# Patient Record
Sex: Female | Born: 1965 | Race: White | Hispanic: No | Marital: Married | Smoking: Current every day smoker
Health system: Southern US, Community
[De-identification: ages and names within clinical notes are randomized; demographics above are authoritative.]

## PROBLEM LIST (undated history)

## (undated) DIAGNOSIS — Z9889 Other specified postprocedural states: Secondary | ICD-10-CM

## (undated) DIAGNOSIS — Z87898 Personal history of other specified conditions: Secondary | ICD-10-CM

## (undated) DIAGNOSIS — N200 Calculus of kidney: Secondary | ICD-10-CM

## (undated) DIAGNOSIS — Z87442 Personal history of urinary calculi: Secondary | ICD-10-CM

## (undated) DIAGNOSIS — F329 Major depressive disorder, single episode, unspecified: Secondary | ICD-10-CM

## (undated) DIAGNOSIS — I4510 Unspecified right bundle-branch block: Secondary | ICD-10-CM

## (undated) DIAGNOSIS — F32A Depression, unspecified: Secondary | ICD-10-CM

## (undated) DIAGNOSIS — R112 Nausea with vomiting, unspecified: Secondary | ICD-10-CM

## (undated) HISTORY — PX: TONSILLECTOMY AND ADENOIDECTOMY: SUR1326

## (undated) HISTORY — PX: NASAL FRACTURE SURGERY: SHX718

## (undated) HISTORY — PX: EXTRACORPOREAL SHOCK WAVE LITHOTRIPSY: SHX1557

---

## 1998-01-15 ENCOUNTER — Other Ambulatory Visit: Admission: RE | Admit: 1998-01-15 | Discharge: 1998-01-15 | Payer: Self-pay | Admitting: *Deleted

## 1999-05-20 ENCOUNTER — Other Ambulatory Visit: Admission: RE | Admit: 1999-05-20 | Discharge: 1999-05-20 | Payer: Self-pay | Admitting: *Deleted

## 1999-05-27 ENCOUNTER — Encounter: Payer: Self-pay | Admitting: *Deleted

## 1999-05-27 ENCOUNTER — Ambulatory Visit (HOSPITAL_COMMUNITY): Admission: RE | Admit: 1999-05-27 | Discharge: 1999-05-27 | Payer: Self-pay | Admitting: *Deleted

## 2000-08-15 ENCOUNTER — Emergency Department (HOSPITAL_COMMUNITY): Admission: EM | Admit: 2000-08-15 | Discharge: 2000-08-16 | Payer: Self-pay | Admitting: *Deleted

## 2000-08-16 ENCOUNTER — Encounter: Payer: Self-pay | Admitting: Emergency Medicine

## 2002-11-29 ENCOUNTER — Other Ambulatory Visit: Admission: RE | Admit: 2002-11-29 | Discharge: 2002-11-29 | Payer: Self-pay | Admitting: Obstetrics and Gynecology

## 2003-04-25 ENCOUNTER — Ambulatory Visit (HOSPITAL_COMMUNITY): Admission: RE | Admit: 2003-04-25 | Discharge: 2003-04-25 | Payer: Self-pay | Admitting: Urology

## 2003-04-27 ENCOUNTER — Emergency Department (HOSPITAL_COMMUNITY): Admission: EM | Admit: 2003-04-27 | Discharge: 2003-04-27 | Payer: Self-pay | Admitting: Emergency Medicine

## 2003-12-16 ENCOUNTER — Ambulatory Visit (HOSPITAL_COMMUNITY): Admission: RE | Admit: 2003-12-16 | Discharge: 2003-12-16 | Payer: Self-pay | Admitting: Urology

## 2004-11-12 ENCOUNTER — Other Ambulatory Visit: Admission: RE | Admit: 2004-11-12 | Discharge: 2004-11-12 | Payer: Self-pay | Admitting: Obstetrics and Gynecology

## 2005-01-28 ENCOUNTER — Encounter: Admission: RE | Admit: 2005-01-28 | Discharge: 2005-01-28 | Payer: Self-pay | Admitting: Obstetrics and Gynecology

## 2006-05-13 ENCOUNTER — Encounter: Admission: RE | Admit: 2006-05-13 | Discharge: 2006-05-13 | Payer: Self-pay | Admitting: Obstetrics and Gynecology

## 2008-05-20 ENCOUNTER — Emergency Department (HOSPITAL_COMMUNITY): Admission: EM | Admit: 2008-05-20 | Discharge: 2008-05-20 | Payer: Self-pay | Admitting: Emergency Medicine

## 2008-05-23 ENCOUNTER — Ambulatory Visit (HOSPITAL_COMMUNITY): Admission: RE | Admit: 2008-05-23 | Discharge: 2008-05-23 | Payer: Self-pay | Admitting: Urology

## 2009-03-17 ENCOUNTER — Emergency Department (HOSPITAL_COMMUNITY): Admission: EM | Admit: 2009-03-17 | Discharge: 2009-03-18 | Payer: Self-pay | Admitting: Emergency Medicine

## 2009-12-12 ENCOUNTER — Encounter: Admission: RE | Admit: 2009-12-12 | Discharge: 2009-12-12 | Payer: Self-pay | Admitting: Family Medicine

## 2009-12-19 ENCOUNTER — Encounter: Admission: RE | Admit: 2009-12-19 | Discharge: 2009-12-19 | Payer: Self-pay | Admitting: Family Medicine

## 2010-05-09 ENCOUNTER — Encounter: Payer: Self-pay | Admitting: Urology

## 2010-05-09 ENCOUNTER — Encounter: Payer: Self-pay | Admitting: Obstetrics and Gynecology

## 2010-07-22 LAB — URINALYSIS, ROUTINE W REFLEX MICROSCOPIC
Ketones, ur: NEGATIVE mg/dL
Leukocytes, UA: NEGATIVE
Nitrite: NEGATIVE
Protein, ur: NEGATIVE mg/dL
Urobilinogen, UA: 1 mg/dL (ref 0.0–1.0)
pH: 8 (ref 5.0–8.0)

## 2010-07-22 LAB — URINE MICROSCOPIC-ADD ON

## 2010-07-22 LAB — POCT PREGNANCY, URINE: Preg Test, Ur: NEGATIVE

## 2010-08-04 LAB — URINALYSIS, ROUTINE W REFLEX MICROSCOPIC
Bilirubin Urine: NEGATIVE
Glucose, UA: NEGATIVE mg/dL
Hgb urine dipstick: NEGATIVE
Hgb urine dipstick: NEGATIVE
Ketones, ur: 15 mg/dL — AB
Nitrite: NEGATIVE
Protein, ur: NEGATIVE mg/dL
Protein, ur: NEGATIVE mg/dL
Specific Gravity, Urine: 1.029 (ref 1.005–1.030)
Urobilinogen, UA: 0.2 mg/dL (ref 0.0–1.0)
pH: 6 (ref 5.0–8.0)

## 2010-08-04 LAB — BASIC METABOLIC PANEL
Calcium: 8.4 mg/dL (ref 8.4–10.5)
Creatinine, Ser: 1.32 mg/dL — ABNORMAL HIGH (ref 0.4–1.2)
GFR calc Af Amer: 53 mL/min — ABNORMAL LOW (ref >60)
GFR calc non Af Amer: 44 mL/min — ABNORMAL LOW (ref >60)

## 2010-08-04 LAB — URINE MICROSCOPIC-ADD ON

## 2010-08-04 LAB — CBC
HCT: 34.2 % — ABNORMAL LOW (ref 36.0–46.0)
Hemoglobin: 11.7 g/dL — ABNORMAL LOW (ref 12.0–15.0)
WBC: 7.7 10*3/uL (ref 4.0–10.5)

## 2010-10-29 ENCOUNTER — Observation Stay (HOSPITAL_COMMUNITY)
Admission: RE | Admit: 2010-10-29 | Discharge: 2010-10-30 | Disposition: A | Discharge status: Home / Self Care | Payer: PRIVATE HEALTH INSURANCE | Source: Ambulatory Visit | Attending: Urology | Admitting: Urology

## 2010-10-29 DIAGNOSIS — N200 Calculus of kidney: Secondary | ICD-10-CM | POA: Insufficient documentation

## 2010-10-29 DIAGNOSIS — R1032 Left lower quadrant pain: Secondary | ICD-10-CM | POA: Insufficient documentation

## 2010-10-29 DIAGNOSIS — N201 Calculus of ureter: Principal | ICD-10-CM | POA: Insufficient documentation

## 2010-10-29 DIAGNOSIS — R110 Nausea: Secondary | ICD-10-CM | POA: Insufficient documentation

## 2010-10-29 DIAGNOSIS — Z79899 Other long term (current) drug therapy: Secondary | ICD-10-CM | POA: Insufficient documentation

## 2010-10-29 DIAGNOSIS — F17200 Nicotine dependence, unspecified, uncomplicated: Secondary | ICD-10-CM | POA: Insufficient documentation

## 2010-10-29 DIAGNOSIS — R509 Fever, unspecified: Secondary | ICD-10-CM | POA: Insufficient documentation

## 2010-10-29 HISTORY — PX: OTHER SURGICAL HISTORY: SHX169

## 2010-10-29 LAB — SURGICAL PCR SCREEN
MRSA, PCR: NEGATIVE
Staphylococcus aureus: NEGATIVE

## 2010-10-30 LAB — URINE CULTURE
Colony Count: NO GROWTH
Culture: NO GROWTH
Special Requests: POSITIVE

## 2010-11-02 ENCOUNTER — Ambulatory Visit (HOSPITAL_COMMUNITY): Payer: PRIVATE HEALTH INSURANCE

## 2010-11-02 ENCOUNTER — Ambulatory Visit (HOSPITAL_COMMUNITY)
Admission: RE | Admit: 2010-11-02 | Discharge: 2010-11-02 | Disposition: A | Discharge status: Home / Self Care | Payer: PRIVATE HEALTH INSURANCE | Source: Ambulatory Visit | Attending: Urology | Admitting: Urology

## 2010-11-02 DIAGNOSIS — R1032 Left lower quadrant pain: Secondary | ICD-10-CM | POA: Insufficient documentation

## 2010-11-02 DIAGNOSIS — N201 Calculus of ureter: Secondary | ICD-10-CM | POA: Insufficient documentation

## 2010-11-02 DIAGNOSIS — R319 Hematuria, unspecified: Secondary | ICD-10-CM | POA: Insufficient documentation

## 2010-11-02 DIAGNOSIS — R110 Nausea: Secondary | ICD-10-CM | POA: Insufficient documentation

## 2010-11-02 DIAGNOSIS — N200 Calculus of kidney: Secondary | ICD-10-CM | POA: Insufficient documentation

## 2010-11-02 DIAGNOSIS — Z01818 Encounter for other preprocedural examination: Secondary | ICD-10-CM | POA: Insufficient documentation

## 2010-11-19 NOTE — Op Note (Signed)
  Angela Santos, Angela Santos                  ACCOUNT NO.:  [id number]  MEDICAL RECORD NO.:  [id number]  LOCATION:  1517                         FACILITY:  [facility]  PHYSICIAN:  Danae Chen, M.D.  DATE OF BIRTH:  31-Dec-1965  DATE OF PROCEDURE:  10/29/2010 DATE OF DISCHARGE:                              OPERATIVE REPORT   PREOPERATIVE DIAGNOSIS:  Left proximal ureteral calculus, brought with fever.  POSTOPERATIVE DIAGNOSIS:  Left proximal ureteral calculus, brought with fever.  PROCEDURE DONE:  Cystoscopy and left retrograde pyelogram and insertion of double-J stent.  SURGEON:  Danae Chen, M.D.  ANESTHESIA:  General.  INDICATION:  Patient is a 45 years old female with a known 6 mm left proximal ureteral calculus.  She has a past history of kidney stones. She had lithotripsy in the past.  She called the office today with temperature of 100.2.  She is scheduled for cystoscopy, retrograde pyelogram and double-J stent.  The patient was identified by her wrist band and proper time-out was taken.  DESCRIPTION OF PROCEDURE:  Under general anesthesia, she was prepped and draped and placed in the dorsal lithotomy position.  A panendoscope was inserted in the bladder.  Urine was sent for culture and sensitivity. The bladder mucosa is reddened.  There is no stone or tumor in the bladder.  The ureteral orifices are in normal position and shape.  Retrograde pyelogram:  A cone-tip catheter was passed through the cystoscope and through the left ureteral orifice.  Contrast was then injected through the cone-tip catheter.  The distal and mid ureter appear normal.  There is a filling defect in the proximal ureter and contrast would not go beyond the filling defect.  The cone-tip catheter was removed.  A sensor wire was passed over a #6-French open-ended catheter and they were both passed through the cystoscope and through the left ureteral orifice and advanced through the mid ureter.  The sensor  wire was removed.  Contrast was then injected through the open- ended catheter and there is a filling defect in the proximal ureter and this time contrast passed beyond the filling defect.  The ureter proximal to the stone appears moderately dilated as well as the collecting system.  Then, the sensor wire was passed through the open- ended catheter and the open-ended catheter was removed.  A #6-French - 24 double-J stent was then passed over the sensor wire.  The sensor wire was removed.  The proximal curl of the double-J stent is in the renal pelvis.  The distal curl is in the bladder.  The bladder was then emptied and the cystoscope removed.  The patient tolerated the procedure well and left the OR in satisfactory condition to postanesthesia care unit.     Danae Chen, M.D.     MN/MEDQ  D:  10/29/2010  T:  10/29/2010  Job:  [id number]  Electronically Signed by Lindaann Slough M.D. on 11/19/2010 09:36:41 PM

## 2011-08-23 ENCOUNTER — Other Ambulatory Visit: Payer: Self-pay | Admitting: Family Medicine

## 2011-08-23 DIAGNOSIS — R42 Dizziness and giddiness: Secondary | ICD-10-CM

## 2011-08-24 ENCOUNTER — Ambulatory Visit
Admission: RE | Admit: 2011-08-24 | Discharge: 2011-08-24 | Disposition: A | Discharge status: Home / Self Care | Payer: PRIVATE HEALTH INSURANCE | Source: Ambulatory Visit | Attending: Family Medicine | Admitting: Family Medicine

## 2011-08-24 DIAGNOSIS — R42 Dizziness and giddiness: Secondary | ICD-10-CM

## 2011-08-27 ENCOUNTER — Other Ambulatory Visit: Payer: Self-pay | Admitting: Family Medicine

## 2011-08-27 DIAGNOSIS — R55 Syncope and collapse: Secondary | ICD-10-CM

## 2011-08-27 DIAGNOSIS — R4701 Aphasia: Secondary | ICD-10-CM

## 2011-09-02 ENCOUNTER — Ambulatory Visit
Admission: RE | Admit: 2011-09-02 | Discharge: 2011-09-02 | Disposition: A | Discharge status: Home / Self Care | Payer: PRIVATE HEALTH INSURANCE | Source: Ambulatory Visit | Attending: Family Medicine | Admitting: Family Medicine

## 2011-09-02 DIAGNOSIS — R55 Syncope and collapse: Secondary | ICD-10-CM

## 2011-09-02 DIAGNOSIS — R4701 Aphasia: Secondary | ICD-10-CM

## 2013-04-19 DIAGNOSIS — Z9889 Other specified postprocedural states: Secondary | ICD-10-CM

## 2013-04-19 DIAGNOSIS — R112 Nausea with vomiting, unspecified: Secondary | ICD-10-CM

## 2013-04-19 HISTORY — DX: Other specified postprocedural states: Z98.890

## 2013-04-19 HISTORY — DX: Nausea with vomiting, unspecified: R11.2

## 2013-09-07 ENCOUNTER — Other Ambulatory Visit: Payer: Self-pay | Admitting: Urology

## 2013-09-12 ENCOUNTER — Encounter (HOSPITAL_COMMUNITY): Payer: Self-pay | Admitting: Pharmacy Technician

## 2013-09-14 ENCOUNTER — Encounter (HOSPITAL_COMMUNITY): Payer: Self-pay | Admitting: General Practice

## 2013-09-19 NOTE — H&P (Signed)
ctive Problems Problems   1. History of kidney stones (V13.01)  2. Nephrolithiasis (592.0)  3. Urinary tract infection (599.0)  History of Present Illness  Angela rDois Davenporteturns today in f/u for her history of stones and UTI's.   She remains on TMP for supression but is having some cloudy urine and some RLQ pain.  He UA looks infected today and on KUB her 10mm left renal stone appears to have moved to the UPJ.  She has additional RLP stones.  She has a tiny LLP stone.  She has had some left flank pain. She has had no fever but she does have some am nausea.   Past Medical History Problems   1. History of hematuria (V13.09)  2. History of kidney stones (V13.01)  3. History of Nephrolithiasis Of The Left Kidney (V13.01)  Surgical History Problems   1. History of Cystoscopy With Insertion Of Ureteral Stent Left  2. History of Lithotripsy  3. History of Lithotripsy  4. History of Lithotripsy  Current Meds  1. Paxil 20 MG Oral Tablet;  Therapy: (Recorded:02Feb2015) to Recorded  2. Trimethoprim 100 MG Oral Tablet; TAKE 1 TABLET Twice daily PT.IS TO TAKE 1 TAB BID  FOR 1 WEEK AND THEN 1 TAB HS;  Therapy: 23Mar2015 to (Last Rx:23Mar2015)  Requested for: 23Mar2015 Ordered  Allergies Medication   1. Penicillins  Family History Problems   1. Family history of Acute Myocardial Infarction (V17.3) : Father  2. Family history of Death In The Family Father : Mother   8163- MI  3. Family history of Family Health Status - Mother's Age   49  4. Family history of Family Health Status Number Of Children   1 son (12)  5. Family history of Heart Disease (V17.49) : Mother  6. Family history of Hematuria : Mother  647. Family history of Nephrolithiasis : Mother  678. Family history of Nephrolithiasis : Father  Social History Problems   1. Denied: History of Alcohol Use  2. Caffeine Use   2 per day  3. Current every day smoker (305.1)  4. Marital History - Currently Married  5. Occupation:    Sec.- [employer]  6. Tobacco Use (V15.82)   smoked 1ppd for 25 yrs & quit 11/16/06  Review of Systems Genitourinary, constitutional, skin, eye, otolaryngeal, hematologic/lymphatic, cardiovascular, pulmonary, endocrine, musculoskeletal, gastrointestinal, neurological and psychiatric system(s) were reviewed and pertinent findings if present are noted.  Genitourinary: cloudy urine, but no urinary frequency, no urinary urgency, no dysuria and no hematuria.  Gastrointestinal: no flank pain   The patient presents with complaints of abdominal pain (right lower quadrant).  Constitutional: no fever.    Vitals Vital Signs [Data Includes: Last 1 Day]  Recorded: 21May2015 02:59PM  Blood Pressure: 114 / 56 Temperature: 97.8 F Heart Rate: 59  Physical Exam Constitutional: Well nourished and well developed . No acute distress.  Pulmonary: No respiratory distress and normal respiratory rhythm and effort.  Cardiovascular: Heart rate and rhythm are normal . No peripheral edema.  Abdomen: The abdomen is flat. The abdomen is soft and nontender. No CVA tenderness.    Results/Data Urine [Data Includes: Last 1 Day]   21May2015  COLOR AMBER   APPEARANCE CLOUDY   SPECIFIC GRAVITY 1.025   pH 5.5   GLUCOSE NEG mg/dL  BILIRUBIN NEG   KETONE NEG mg/dL  BLOOD LARGE   PROTEIN TRACE mg/dL  UROBILINOGEN 2 mg/dL  NITRITE POS   LEUKOCYTE ESTERASE SMALL   SQUAMOUS EPITHELIAL/HPF NONE SEEN  WBC 7-10 WBC/hpf  RBC 21-50 RBC/hpf  BACTERIA MODERATE   CRYSTALS NONE SEEN   CASTS NONE SEEN    KUB today shows a 30mm right UPJ stone with smaller RLP stones. There is a 2-31mm LLP stone. She has no other ureteral or bladder stones. The bones, soft tissues and gas patterns are unremarkable.    Assessment  Laylonie has evidence of a recurrent/persistent UTI and her largest right renal stone has moved to the RUPJ.  She has no fever but does have some pain.   Plan  Health Maintenance   1. UA With REFLEX; [Do  Not Release]; Status:Resulted - Requires Verification;   Done:  21May2015 02:33PM Nephrolithiasis   2. RENAL U/S COMPLETE; Status:Hold For - Appointment,Date of Service; Requested  for:21May2015;   3. Follow-up Schedule Surgery Office  Follow-up  Status: Hold For - Appointment   Requested for: 21May2015 Nephrolithiasis, Urinary tract infection   4. Start: Ciprofloxacin HCl - 500 MG Oral Tablet; Take 1 tablet twice daily   Urine culture today. I will start Cipro pending the culture and stop the TMP. I discussed the treatment options including PCNL and ESWL.  With the number of stones I would be more reluctant to consider ureteroscopy.  She will get set up for ESWL no sooner than a week to allow treatment of the UTI. Risks of the ESWL reviewed.  Renal US today to assess the degree of obstruction if any.    The US showed mild hydro on the right.   Urine culture grew e. Coli and she is on Cipro per sensitivities.

## 2013-09-20 ENCOUNTER — Ambulatory Visit (HOSPITAL_COMMUNITY)
Admission: RE | Admit: 2013-09-20 | Discharge: 2013-09-20 | Disposition: A | Discharge status: Home / Self Care | Payer: PRIVATE HEALTH INSURANCE | Source: Ambulatory Visit | Attending: Urology | Admitting: Urology

## 2013-09-20 ENCOUNTER — Encounter (HOSPITAL_COMMUNITY): Payer: Self-pay | Admitting: *Deleted

## 2013-09-20 ENCOUNTER — Ambulatory Visit (HOSPITAL_COMMUNITY): Payer: PRIVATE HEALTH INSURANCE

## 2013-09-20 ENCOUNTER — Encounter (HOSPITAL_COMMUNITY)
Admission: RE | Disposition: A | Discharge status: Home / Self Care | Payer: Self-pay | Source: Ambulatory Visit | Attending: Urology

## 2013-09-20 DIAGNOSIS — Z79899 Other long term (current) drug therapy: Secondary | ICD-10-CM | POA: Insufficient documentation

## 2013-09-20 DIAGNOSIS — N390 Urinary tract infection, site not specified: Secondary | ICD-10-CM | POA: Insufficient documentation

## 2013-09-20 DIAGNOSIS — F17200 Nicotine dependence, unspecified, uncomplicated: Secondary | ICD-10-CM | POA: Insufficient documentation

## 2013-09-20 DIAGNOSIS — N200 Calculus of kidney: Secondary | ICD-10-CM | POA: Insufficient documentation

## 2013-09-20 HISTORY — DX: Calculus of kidney: N20.0

## 2013-09-20 HISTORY — DX: Depression, unspecified: F32.A

## 2013-09-20 HISTORY — DX: Major depressive disorder, single episode, unspecified: F32.9

## 2013-09-20 LAB — PREGNANCY, URINE: Preg Test, Ur: NEGATIVE

## 2013-09-20 SURGERY — LITHOTRIPSY, ESWL
Anesthesia: LOCAL | Laterality: Right

## 2013-09-20 MED ORDER — CIPROFLOXACIN HCL 500 MG PO TABS
500.0000 mg | ORAL_TABLET | ORAL | Status: AC
  Administered 2013-09-20: 500 mg via ORAL
  Filled 2013-09-20: qty 1

## 2013-09-20 MED ORDER — HYDROCODONE-ACETAMINOPHEN 5-325 MG PO TABS: 1.0000 | ORAL_TABLET | Freq: Four times a day (QID) | ORAL | Status: DC | PRN

## 2013-09-20 MED ORDER — DIAZEPAM 5 MG PO TABS
10.0000 mg | ORAL_TABLET | ORAL | Status: AC
  Administered 2013-09-20: 10 mg via ORAL
  Filled 2013-09-20: qty 2

## 2013-09-20 MED ORDER — PROMETHAZINE HCL 50 MG PO TABS: 25.0000 mg | ORAL_TABLET | Freq: Four times a day (QID) | ORAL | Status: DC | PRN

## 2013-09-20 MED ORDER — DEXTROSE-NACL 5-0.45 % IV SOLN
INTRAVENOUS | Status: DC
  Administered 2013-09-20: 08:00:00 via INTRAVENOUS

## 2013-09-20 MED ORDER — DIPHENHYDRAMINE HCL 25 MG PO CAPS
25.0000 mg | ORAL_CAPSULE | ORAL | Status: AC
  Administered 2013-09-20: 25 mg via ORAL
  Filled 2013-09-20: qty 1

## 2013-09-20 NOTE — Discharge Instructions (Signed)
Lithotripsy, Care After °Refer to this sheet in the next few weeks. These instructions provide you with information on caring for yourself after your procedure. Your health care provider may also give you more specific instructions. Your treatment has been planned according to current medical practices, but problems sometimes occur. Call your health care provider if you have any problems or questions after your procedure. °WHAT TO EXPECT AFTER THE PROCEDURE  °· Your urine may have a red tinge for a few days after treatment. Blood loss is usually minimal. °· You may have soreness in the back or flank area. This usually goes away after a few days. The procedure can cause blotches or bruises on the back where the pressure wave enters the skin. These marks usually cause only minimal discomfort and should disappear in a short time. °· Stone fragments should begin to pass within 24 hours of treatment. However, a delayed passage is not unusual. °· You may have pain, discomfort, and feel sick to your stomach (nauseated) when the crushed fragments of stone are passed down the tube from the kidney to the bladder. Stone fragments can pass soon after the procedure and may last for up to 4 8 weeks. °· A small number of patients may have severe pain when stone fragments are not able to pass, which leads to an obstruction. °· If your stone is greater than 1 inch (2.5 cm) in diameter or if you have multiple stones that have a combined diameter greater than 1 inch (2.5 cm), you may require more than one treatment. °· If you had a stent placed prior to your procedure, you may experience some discomfort, especially during urination. You may experience the pain or discomfort in your flank or back, or you may experience a sharp pain or discomfort at the base of your penis or in your lower abdomen. The discomfort usually lasts only a few minutes after urinating. °HOME CARE INSTRUCTIONS  °· Rest at home until you feel your energy  improving. °· Only take over-the-counter or prescription medicines for pain, discomfort, or fever as directed by your health care provider. Depending on the type of lithotripsy, you may need to take antibiotics and anti-inflammatory medicines for a few days. °· Drink enough water and fluids to keep your urine clear or pale yellow. This helps "flush" your kidneys. It helps pass any remaining pieces of stone and prevents stones from coming back. °· Most people can resume daily activities within 1 2 days after standard lithotripsy. It can take longer to recover from laser and percutaneous lithotripsy. °· If the stones are in your urinary system, you may be asked to strain your urine at home to look for stones. Any stones that are found can be sent to a medical lab for examination. °· Visit your health care provider for a follow-up appointment in a few weeks. Your doctor may remove your stent if you have one. Your health care provider will also check to see whether stone particles still remain. °SEEK MEDICAL CARE IF:  °· Your pain is not relieved by medicine. °· You have a lasting nauseous feeling. °· You feel there is too much blood in the urine. °· You develop persistent problems with frequent or painful urination that does not at least partially improve after 2 days following the procedure. °· You have a congested cough. °· You feel lightheaded. °· You develop a rash or any other signs that might suggest an allergic problem. °· You develop any reaction or side   effects to your medicine(s). °SEEK IMMEDIATE MEDICAL CARE IF:  °· You experience severe back or flank pain or both. °· You see nothing but blood when you urinate. °· You cannot pass any urine at all. °· You have a fever or shaking chills. °· You develop shortness of breath, difficulty breathing, or chest pain. °· You develop vomiting that will not stop after 6 8 hours. °· You have a fainting episode. °Document Released: 04/25/2007 Document Revised: 01/24/2013  Document Reviewed: 10/19/2012 °Exit[employer]® Patient Information ©2014 [employer]. ° °

## 2013-12-20 ENCOUNTER — Ambulatory Visit (INDEPENDENT_AMBULATORY_CARE_PROVIDER_SITE_OTHER): Payer: PRIVATE HEALTH INSURANCE | Admitting: Interventional Cardiology

## 2013-12-20 ENCOUNTER — Encounter: Payer: Self-pay | Admitting: Interventional Cardiology

## 2013-12-20 ENCOUNTER — Encounter: Payer: Self-pay | Admitting: *Deleted

## 2013-12-20 ENCOUNTER — Encounter (INDEPENDENT_AMBULATORY_CARE_PROVIDER_SITE_OTHER): Payer: PRIVATE HEALTH INSURANCE

## 2013-12-20 VITALS — BP 120/60 | HR 55 | Ht 62.0 in | Wt 106.1 lb

## 2013-12-20 DIAGNOSIS — F3289 Other specified depressive episodes: Secondary | ICD-10-CM

## 2013-12-20 DIAGNOSIS — R55 Syncope and collapse: Secondary | ICD-10-CM | POA: Insufficient documentation

## 2013-12-20 DIAGNOSIS — F329 Major depressive disorder, single episode, unspecified: Secondary | ICD-10-CM

## 2013-12-20 DIAGNOSIS — F419 Anxiety disorder, unspecified: Secondary | ICD-10-CM

## 2013-12-20 DIAGNOSIS — F32A Depression, unspecified: Secondary | ICD-10-CM

## 2013-12-20 DIAGNOSIS — F411 Generalized anxiety disorder: Secondary | ICD-10-CM

## 2013-12-20 NOTE — Progress Notes (Unsigned)
Patient ID: Angela Santos, female   DOB: [birth year], 48 y.o.   MRN: [id number] E-Cardio 48 hour holter monitor applied to patient.

## 2013-12-20 NOTE — Patient Instructions (Signed)
Your physician recommends that you continue on your current medications as directed. Please refer to the Current Medication list given to you today.  Your physician has recommended that you wear a holter monitor. Holter monitors are medical devices that record the heart's electrical activity. Doctors most often use these monitors to diagnose arrhythmias. Arrhythmias are problems with the speed or rhythm of the heartbeat. The monitor is a small, portable device. You can wear one while you do your normal daily activities. This is usually used to diagnose what is causing palpitations/syncope (passing out).   Your physician recommends that you schedule a follow-up appointment pending results

## 2013-12-20 NOTE — Progress Notes (Signed)
Patient ID: Angela Santos, female   DOB: July 04, 1965, 48 y.o.   MRN: [id number]   Date: 12/20/2013 ID: Angela Santos, DOB [birth year], MRN [id number] PCP: Kaleen Mask, MD  Reason: Near syncope  ASSESSMENT;  1. Vasovagal near syncope, recurrent 2. Anxiety disorder depression and stress 3. Tobacco abuse  PLAN:  1. 48 hour Holter monitor 2. Provocation maneuvers and safe positions if the prodrome for her vasovagal episodes occur. Caution to lie down, placed a call Powell or ice on the neck for head, and elevate legs. She should stay on until the prodrome resolves. Caution not to drive or attempt to "make it somewhere" before pulling off the road if prodrome starts while driving.   SUBJECTIVE: Angela Santos is a 48 y.o. female who is is a 20 year history of recurring episodes of nausea diaphoresis weakness and near fainting. She has never had true syncope. Episodes tend to occur when she is under a lot of stress. She has an anxiety/depression condition. She is on a serotonin uptake inhibitor, Paxil. She has never had prolonged palpitations. She denies chest pain. She has a history of relatively slow heart rate. Family members have measured her heart rate and blood pressure during episodes of weakness on file the heart rate to be really slow and the blood pressure less than 90 mm mercury. She has never had medical attention for these recurring episodes. She can do fine for several months and then another time she has recurring episodes up to 3-5 in 1 day. She denies orthopnea, PND, claudication, transient neurological symptoms, tachycardia, and family history of sudden death. Both parents have coronary artery disease.   Allergies  Allergen Reactions  . Penicillins Rash    Current Outpatient Prescriptions on File Prior to Visit  Medication Sig Dispense Refill  . HYDROcodone-acetaminophen (NORCO) 5-325 MG per tablet Take 1 tablet by mouth every 6 (six) hours as needed for moderate pain.  30  tablet  0  . ibuprofen (ADVIL,MOTRIN) 200 MG tablet Take 400 mg by mouth every 6 (six) hours as needed (Pain).      Marland Kitchen PARoxetine (PAXIL) 20 MG tablet Take 30 mg by mouth every morning.      . promethazine (PHENERGAN) 50 MG tablet Take 0.5 tablets (25 mg total) by mouth every 6 (six) hours as needed for nausea or vomiting.  20 tablet  0  . ciprofloxacin (CIPRO) 500 MG tablet Take 500 mg by mouth 2 (two) times daily. Started 09/06/13. Pt has not been given end date yet      . tetrahydrozoline 0.05 % ophthalmic solution Place 1 drop into both eyes as needed (dry/irritated eyes).       No current facility-administered medications on file prior to visit.    Past Medical History  Diagnosis Date  . Kidney stones     bilateral   . Depression   . Nephrolithiasis 09/20/2013    Right renal stone.  . Urinary tract infection May 2015    treated with cipro prior to ESWL.    Past Surgical History  Procedure Laterality Date  . Tonsillectomy      as a child  . Nose surgery      25 years ago, broke nose, repaired  . Lithotripsy      History   Social History  . Marital Status: Married    Spouse Name: N/A    Number of Children: N/A  . Years of Education: N/A   Occupational History  . Not on  file.   Social History Main Topics  . Smoking status: Current Every Day Smoker -- 0.50 packs/day for 35 years    Types: Cigarettes  . Smokeless tobacco: Never Used  . Alcohol Use: No  . Drug Use: No  . Sexual Activity: Yes   Other Topics Concern  . Not on file   Social History Narrative  . No narrative on file    Family History  Problem Relation Age of Onset  . Heart attack Mother   . Heart failure Father   . Heart attack Father     ROS: No history of stroke, asthma, COPD, liver disease, GI bleeding, diabetes, vision disturbance, or claudication per. Other systems negative for complaints.  OBJECTIVE: BP 120/60  Pulse 55  Ht  (1.575 m)  Wt 106 lb 1.9 oz (48.136 kg)  BMI 19.40  kg/m2,  General: No acute distress, more tanned than expected HEENT: normal without jaundice or pallor Neck: JVD flat. Carotids absent Chest: Clear Cardiac: Murmur: Absent. Gallop: Absent. Rhythm: Normal. Other: normal Abdomen: Bruit: Normal. Pulsation: Absent Extremities: Edema: Absent. Pulses: 2+ and symmetric Neuro: Normal Psych: Normal  ECG: Sinus bradycardia with a complete right bundle branch block and otherwise unremarkable

## 2013-12-21 ENCOUNTER — Encounter: Payer: Self-pay | Admitting: Interventional Cardiology

## 2014-01-02 ENCOUNTER — Telehealth: Payer: Self-pay

## 2014-01-02 NOTE — Telephone Encounter (Signed)
lmom. holter monitor was normal.

## 2014-01-08 ENCOUNTER — Telehealth: Payer: Self-pay | Admitting: Interventional Cardiology

## 2014-01-08 NOTE — Telephone Encounter (Signed)
New message  ° ° °Patient calling for test results.   °

## 2014-01-08 NOTE — Telephone Encounter (Signed)
Follow up ° ° ° ° ° ° ° ° ° °Pt returning nurse call  °

## 2014-01-08 NOTE — Telephone Encounter (Signed)
2nd attempt to give pt holter results.lmtcb

## 2014-01-08 NOTE — Telephone Encounter (Signed)
3rd attempt.lmom  Holter monitor was Normal. pt to call back if add questions

## 2014-02-15 ENCOUNTER — Other Ambulatory Visit: Payer: Self-pay | Admitting: Urology

## 2014-02-25 ENCOUNTER — Encounter (HOSPITAL_BASED_OUTPATIENT_CLINIC_OR_DEPARTMENT_OTHER): Payer: Self-pay | Admitting: *Deleted

## 2014-02-27 ENCOUNTER — Encounter (HOSPITAL_BASED_OUTPATIENT_CLINIC_OR_DEPARTMENT_OTHER): Payer: Self-pay | Admitting: *Deleted

## 2014-02-27 NOTE — H&P (Signed)
Active Problems Problems  1. Calculus of left ureter (N20.1) 2. History of kidney stones (Z87.442) 3. Microscopic hematuria (R31.2) 4. Nephrolithiasis (N20.0) 5. Urinary tract infection (N39.0)  History of Present Illness Angela Santos returns today in f/u for her history of stone disease. She is having right groin pain that is moderate and she has had some AM nausea. The pain has been there for about 5 days.  She had a right renal ESWL on 09/20/13 and has a 7-8mm fragment in the RUP and a 9mm cluster in the RLP. She has TNTC RBC's today on UA. She has no associated signs or symptoms.   Past Medical History Problems  1. History of Calculus of right ureter (N20.1) 2. History of hematuria (Z87.448) 3. History of kidney stones (Z87.442) 4. History of Nephrolithiasis Of The Left Kidney  Surgical History Problems  1. History of Cystoscopy With Insertion Of Ureteral Stent Left 2. History of Lithotripsy 3. History of Lithotripsy 4. History of Lithotripsy 5. History of Lithotripsy  Current Meds 1. Paxil 20 MG Oral Tablet;  Therapy: (Recorded:02Feb2015) to Recorded  Allergies Medication  1. Penicillins  Family History Problems  1. Family history of Acute Myocardial Infarction : Father 2. Family history of Death In The Family Father : Mother   63- MI 3. Family history of Family Health Status - Mother's Age   68 4. Family history of Family Health Status Number Of Children   1 son (12) 5. Family history of Heart Disease : Mother 6. Family history of Hematuria : Mother 7. Family history of Nephrolithiasis : Mother 8. Family history of Nephrolithiasis : Father  Social History Problems  1. Denied: History of Alcohol Use 2. Caffeine Use   2 per day 3. Current every day smoker (F17.200) 4. Marital History - Currently Married 5. Occupation:   Sec.- [employer] 6. Tobacco Use   smoked 1ppd for 25 yrs & quit 11/16/06  Review of Systems  Genitourinary: hematuria, but no  urinary frequency and no urinary urgency.  Gastrointestinal: nausea and abdominal pain.  Constitutional: no fever.    Vitals Vital Signs [Data Includes: Last 1 Day]  Recorded: 28Oct2015 03:37PM  Blood Pressure: 97 / 63 Temperature: 98.5 F Heart Rate: 69  Results/Data Urine [Data Includes: Last 1 Day]   28Oct2015  COLOR YELLOW   APPEARANCE CLOUDY   SPECIFIC GRAVITY <1.005   pH 7.0   GLUCOSE NEG mg/dL  BILIRUBIN NEG   KETONE NEG mg/dL  BLOOD LARGE   PROTEIN NEG mg/dL  UROBILINOGEN 1 mg/dL  NITRITE NEG   LEUKOCYTE ESTERASE NEG   SQUAMOUS EPITHELIAL/HPF RARE   WBC 0-2 WBC/hpf  RBC TNTC RBC/hpf  BACTERIA MANY   CRYSTALS NONE SEEN   CASTS NONE SEEN   Other SEE NOTE    The following images/tracing/specimen were independently visualized:  KUB today shows an 8mm RUP fragment and a 9mm RLP cluster of fragments. I don't see any left renal stones. No ureteral stones are seen. She has a phlebolith in the right pelvis. She has minimal lower lumbar disc disease. No other abnormalities are noted.  The following clinical lab reports were reviewed:  UA reviewed.    Assessment Assessed  1. Microscopic hematuria (R31.2) 2. Nephrolithiasis (N20.0)  She has hematuria and right sided pain but I only see renal stones on KUB.   Plan Health Maintenance  1. UA With REFLEX; [Do Not Release]; Status:Resulted - Requires Verification;   Done:  28Oct2015 03:02PM Microscopic hematuria  2. URINE CULTURE;   Status:Hold For - Specimen/Data Collection,Appointment; Requested  for:28Oct2015;  Nephrolithiasis  3. AU CT-STONE PROTOCOL; Status:Hold For - Appointment,PreCert,Date of Service,Print;  Requested for:28Oct2015;  4. Follow-up Office  Follow-up  Status: Hold For - Appointment,Date of Service  Requested  for: 28Oct2015  I am going to get a CT urogram to evaluate her complaints.  I will call her with the results and arrange f/u accordingly.   She has stones bilaterally with the bulk on the  right and needs further evaluation of her hematuria.  I have reviewed the options with her and will get her set up for cystoscopy with bilateral RTG's and right ureteroscopy with holmium and stone extraction.  I reviewed the risks of bleeding, infection, ureteral injury, need for stent and secondary procedures, thrombotic events and anesthetic complications.

## 2014-02-27 NOTE — Progress Notes (Signed)
NPO AFTER MN. ARRIVE AT 0715. NEEDS HG. WILL TAKE PAXIL AND IF NEEDED TAKE PAIN/ NAUSEA RX.

## 2014-02-27 NOTE — Anesthesia Preprocedure Evaluation (Addendum)
Anesthesia Evaluation  Patient identified by MRN, date of birth, ID band Patient awake    Reviewed: Allergy & Precautions, H&P , NPO status , Patient's Chart, lab work & pertinent test results  History of Anesthesia Complications Negative for: history of anesthetic complications  Airway Mallampati: II  TM Distance: >3 FB Neck ROM: Full    Dental no notable dental hx. (+) Dental Advisory Given, Teeth Intact   Pulmonary Current Smoker,  breath sounds clear to auscultation  Pulmonary exam normal       Cardiovascular negative cardio ROS  + dysrhythmias (RBBB) Rhythm:Regular Rate:Normal     Neuro/Psych PSYCHIATRIC DISORDERS Anxiety Depression negative neurological ROS     GI/Hepatic negative GI ROS, Neg liver ROS,   Endo/Other  negative endocrine ROS  Renal/GU Renal disease  negative genitourinary   Musculoskeletal negative musculoskeletal ROS (+)   Abdominal   Peds negative pediatric ROS (+)  Hematology negative hematology ROS (+)   Anesthesia Other Findings   Reproductive/Obstetrics negative OB ROS                           Anesthesia Physical Anesthesia Plan  ASA: II  Anesthesia Plan: General   Post-op Pain Management:    Induction: Intravenous  Airway Management Planned: LMA  Additional Equipment:   Intra-op Plan:   Post-operative Plan: Extubation in OR  Informed Consent: I have reviewed the patients History and Physical, chart, labs and discussed the procedure including the risks, benefits and alternatives for the proposed anesthesia with the patient or authorized representative who has indicated his/her understanding and acceptance.   Dental advisory given  Plan Discussed with: CRNA  Anesthesia Plan Comments:        Anesthesia Quick Evaluation

## 2014-02-28 ENCOUNTER — Ambulatory Visit (HOSPITAL_BASED_OUTPATIENT_CLINIC_OR_DEPARTMENT_OTHER)
Admission: RE | Admit: 2014-02-28 | Discharge: 2014-02-28 | Disposition: A | Discharge status: Home / Self Care | Payer: PRIVATE HEALTH INSURANCE | Source: Ambulatory Visit | Attending: Urology | Admitting: Urology

## 2014-02-28 ENCOUNTER — Ambulatory Visit (HOSPITAL_BASED_OUTPATIENT_CLINIC_OR_DEPARTMENT_OTHER): Payer: PRIVATE HEALTH INSURANCE | Admitting: Anesthesiology

## 2014-02-28 ENCOUNTER — Encounter (HOSPITAL_BASED_OUTPATIENT_CLINIC_OR_DEPARTMENT_OTHER)
Admission: RE | Disposition: A | Discharge status: Home / Self Care | Payer: Self-pay | Source: Ambulatory Visit | Attending: Urology

## 2014-02-28 ENCOUNTER — Encounter (HOSPITAL_BASED_OUTPATIENT_CLINIC_OR_DEPARTMENT_OTHER): Payer: Self-pay | Admitting: *Deleted

## 2014-02-28 DIAGNOSIS — N390 Urinary tract infection, site not specified: Secondary | ICD-10-CM | POA: Diagnosis not present

## 2014-02-28 DIAGNOSIS — Z880 Allergy status to penicillin: Secondary | ICD-10-CM | POA: Diagnosis not present

## 2014-02-28 DIAGNOSIS — N202 Calculus of kidney with calculus of ureter: Secondary | ICD-10-CM | POA: Diagnosis not present

## 2014-02-28 DIAGNOSIS — F419 Anxiety disorder, unspecified: Secondary | ICD-10-CM | POA: Insufficient documentation

## 2014-02-28 DIAGNOSIS — F329 Major depressive disorder, single episode, unspecified: Secondary | ICD-10-CM | POA: Insufficient documentation

## 2014-02-28 DIAGNOSIS — I4510 Unspecified right bundle-branch block: Secondary | ICD-10-CM | POA: Diagnosis not present

## 2014-02-28 DIAGNOSIS — Z87891 Personal history of nicotine dependence: Secondary | ICD-10-CM | POA: Diagnosis not present

## 2014-02-28 HISTORY — DX: Unspecified right bundle-branch block: I45.10

## 2014-02-28 HISTORY — PX: HOLMIUM LASER APPLICATION: SHX5852

## 2014-02-28 HISTORY — DX: Personal history of other specified conditions: Z87.898

## 2014-02-28 HISTORY — PX: CYSTOSCOPY/RETROGRADE/URETEROSCOPY/STONE EXTRACTION WITH BASKET: SHX5317

## 2014-02-28 LAB — POCT HEMOGLOBIN-HEMACUE: HEMOGLOBIN: 14.2 g/dL (ref 12.0–15.0)

## 2014-02-28 LAB — POCT PREGNANCY, URINE: PREG TEST UR: NEGATIVE

## 2014-02-28 SURGERY — CYSTOSCOPY, WITH CALCULUS REMOVAL USING BASKET
Anesthesia: General | Site: Ureter | Laterality: Right

## 2014-02-28 MED ORDER — BELLADONNA ALKALOIDS-OPIUM 16.2-60 MG RE SUPP
RECTAL | Status: DC | PRN
  Administered 2014-02-28: 1 via RECTAL

## 2014-02-28 MED ORDER — MIDAZOLAM HCL 2 MG/2ML IJ SOLN
INTRAMUSCULAR | Status: AC
  Filled 2014-02-28: qty 2

## 2014-02-28 MED ORDER — ACETAMINOPHEN 10 MG/ML IV SOLN
INTRAVENOUS | Status: DC | PRN
  Administered 2014-02-28: 1000 mg via INTRAVENOUS

## 2014-02-28 MED ORDER — FENTANYL CITRATE 0.05 MG/ML IJ SOLN
INTRAMUSCULAR | Status: DC | PRN
  Administered 2014-02-28: 25 ug via INTRAVENOUS
  Administered 2014-02-28 (×2): 50 ug via INTRAVENOUS
  Administered 2014-02-28: 25 ug via INTRAVENOUS
  Administered 2014-02-28: 50 ug via INTRAVENOUS

## 2014-02-28 MED ORDER — MIDAZOLAM HCL 5 MG/5ML IJ SOLN
INTRAMUSCULAR | Status: DC | PRN
  Administered 2014-02-28: 2 mg via INTRAVENOUS

## 2014-02-28 MED ORDER — PROMETHAZINE HCL 25 MG/ML IJ SOLN
INTRAMUSCULAR | Status: AC
  Filled 2014-02-28: qty 1

## 2014-02-28 MED ORDER — IOHEXOL 350 MG/ML SOLN
INTRAVENOUS | Status: DC | PRN
  Administered 2014-02-28: 20 mL

## 2014-02-28 MED ORDER — ONDANSETRON HCL 4 MG/2ML IJ SOLN
4.0000 mg | Freq: Once | INTRAMUSCULAR | Status: DC | PRN
  Filled 2014-02-28: qty 2

## 2014-02-28 MED ORDER — EPHEDRINE SULFATE 50 MG/ML IJ SOLN
INTRAMUSCULAR | Status: DC | PRN
  Administered 2014-02-28: 10 mg via INTRAVENOUS

## 2014-02-28 MED ORDER — FENTANYL CITRATE 0.05 MG/ML IJ SOLN
25.0000 ug | INTRAMUSCULAR | Status: DC | PRN
  Filled 2014-02-28: qty 1

## 2014-02-28 MED ORDER — SCOPOLAMINE 1 MG/3DAYS TD PT72
1.0000 | MEDICATED_PATCH | TRANSDERMAL | Status: DC
  Administered 2014-02-28: 1.5 mg via TRANSDERMAL
  Filled 2014-02-28: qty 1

## 2014-02-28 MED ORDER — ONDANSETRON HCL 4 MG/2ML IJ SOLN
INTRAMUSCULAR | Status: DC | PRN
  Administered 2014-02-28: 4 mg via INTRAVENOUS

## 2014-02-28 MED ORDER — PROMETHAZINE HCL 25 MG/ML IJ SOLN
12.5000 mg | Freq: Four times a day (QID) | INTRAMUSCULAR | Status: DC | PRN
Start: 2014-02-28 — End: 2014-02-28
  Administered 2014-02-28: 6.25 mg via INTRAVENOUS
  Filled 2014-02-28: qty 1

## 2014-02-28 MED ORDER — LACTATED RINGERS IV SOLN
INTRAVENOUS | Status: DC
Start: 2014-02-28 — End: 2014-02-28
  Administered 2014-02-28: 08:00:00 via INTRAVENOUS
  Filled 2014-02-28: qty 1000

## 2014-02-28 MED ORDER — CIPROFLOXACIN IN D5W 400 MG/200ML IV SOLN
400.0000 mg | INTRAVENOUS | Status: AC
  Administered 2014-02-28: 400 mg via INTRAVENOUS
  Filled 2014-02-28: qty 200

## 2014-02-28 MED ORDER — PHENAZOPYRIDINE HCL 100 MG PO TABS: 100.0000 mg | ORAL_TABLET | Freq: Three times a day (TID) | ORAL | Status: DC | PRN

## 2014-02-28 MED ORDER — CIPROFLOXACIN IN D5W 400 MG/200ML IV SOLN
INTRAVENOUS | Status: AC
  Filled 2014-02-28: qty 200

## 2014-02-28 MED ORDER — BELLADONNA ALKALOIDS-OPIUM 16.2-60 MG RE SUPP
RECTAL | Status: AC
  Filled 2014-02-28: qty 1

## 2014-02-28 MED ORDER — HYDROCODONE-ACETAMINOPHEN 5-325 MG PO TABS: 1.0000 | ORAL_TABLET | Freq: Four times a day (QID) | ORAL | Status: DC | PRN

## 2014-02-28 MED ORDER — SCOPOLAMINE 1 MG/3DAYS TD PT72
MEDICATED_PATCH | TRANSDERMAL | Status: AC
  Filled 2014-02-28: qty 1

## 2014-02-28 MED ORDER — SODIUM CHLORIDE 0.9 % IR SOLN
Status: DC | PRN
  Administered 2014-02-28 (×2): 3000 mL

## 2014-02-28 MED ORDER — FENTANYL CITRATE 0.05 MG/ML IJ SOLN
INTRAMUSCULAR | Status: AC
  Filled 2014-02-28: qty 4

## 2014-02-28 MED ORDER — LIDOCAINE HCL (CARDIAC) 20 MG/ML IV SOLN
INTRAVENOUS | Status: DC | PRN
  Administered 2014-02-28: 50 mg via INTRAVENOUS

## 2014-02-28 MED ORDER — PROPOFOL 10 MG/ML IV BOLUS
INTRAVENOUS | Status: DC | PRN
  Administered 2014-02-28: 50 mg via INTRAVENOUS
  Administered 2014-02-28: 20 mg via INTRAVENOUS
  Administered 2014-02-28: 150 mg via INTRAVENOUS

## 2014-02-28 MED ORDER — DEXAMETHASONE SODIUM PHOSPHATE 4 MG/ML IJ SOLN
INTRAMUSCULAR | Status: DC | PRN
Start: 2014-02-28 — End: 2014-02-28
  Administered 2014-02-28: 10 mg via INTRAVENOUS

## 2014-02-28 SURGICAL SUPPLY — 44 items
BAG DRAIN URO-CYSTO SKYTR STRL (DRAIN) ×4 IMPLANT
BAG DRN UROCATH (DRAIN) ×2
BASKET LASER NITINOL 1.9FR (BASKET) IMPLANT
BASKET STONE 1.7 NGAGE (UROLOGICAL SUPPLIES) ×3 IMPLANT
BASKET ZERO TIP NITINOL 2.4FR (BASKET) IMPLANT
BSKT STON RTRVL 120 1.9FR (BASKET)
BSKT STON RTRVL ZERO TP 2.4FR (BASKET)
CANISTER SUCT LVC 12 LTR MEDI- (MISCELLANEOUS) ×4 IMPLANT
CATH URET 5FR 28IN CONE TIP (BALLOONS)
CATH URET 5FR 28IN OPEN ENDED (CATHETERS) ×3 IMPLANT
CATH URET 5FR 70CM CONE TIP (BALLOONS) IMPLANT
CLOTH BEACON ORANGE TIMEOUT ST (SAFETY) ×4 IMPLANT
DRAPE CAMERA CLOSED 9X96 (DRAPES) ×4 IMPLANT
ELECT REM PT RETURN 9FT ADLT (ELECTROSURGICAL)
ELECTRODE REM PT RTRN 9FT ADLT (ELECTROSURGICAL) IMPLANT
FIBER LASER FLEXIVA 1000 (UROLOGICAL SUPPLIES) IMPLANT
FIBER LASER FLEXIVA 200 (UROLOGICAL SUPPLIES) IMPLANT
FIBER LASER FLEXIVA 365 (UROLOGICAL SUPPLIES) IMPLANT
FIBER LASER FLEXIVA 550 (UROLOGICAL SUPPLIES) IMPLANT
FIBER LASER TRAC TIP (UROLOGICAL SUPPLIES) ×3 IMPLANT
GLOVE BIO SURGEON STRL SZ 6.5 (GLOVE) ×1 IMPLANT
GLOVE BIO SURGEON STRL SZ7.5 (GLOVE) ×2 IMPLANT
GLOVE BIO SURGEONS STRL SZ 6.5 (GLOVE) ×1
GLOVE BIOGEL PI IND STRL 6.5 (GLOVE) IMPLANT
GLOVE BIOGEL PI INDICATOR 6.5 (GLOVE) ×4
GLOVE SURG SS PI 8.0 STRL IVOR (GLOVE) ×4 IMPLANT
GOWN PREVENTION PLUS LG XLONG (DISPOSABLE) IMPLANT
GOWN STRL REUS W/ TWL LRG LVL3 (GOWN DISPOSABLE) IMPLANT
GOWN STRL REUS W/ TWL XL LVL3 (GOWN DISPOSABLE) IMPLANT
GOWN STRL REUS W/TWL LRG LVL3 (GOWN DISPOSABLE) ×4
GOWN STRL REUS W/TWL XL LVL3 (GOWN DISPOSABLE) ×8
GUIDEWIRE 0.038 PTFE COATED (WIRE) IMPLANT
GUIDEWIRE ANG ZIPWIRE 038X150 (WIRE) IMPLANT
GUIDEWIRE STR DUAL SENSOR (WIRE) ×4 IMPLANT
IV NS IRRIG 3000ML ARTHROMATIC (IV SOLUTION) ×6 IMPLANT
KIT BALLIN UROMAX 15FX10 (LABEL) IMPLANT
KIT BALLN UROMAX 15FX4 (MISCELLANEOUS) ×1 IMPLANT
KIT BALLN UROMAX 26 75X4 (MISCELLANEOUS) ×2
PACK CYSTO (CUSTOM PROCEDURE TRAY) ×4 IMPLANT
SET HIGH PRES BAL DIL (LABEL)
SHEATH ACCESS URETERAL 38CM (SHEATH) ×3 IMPLANT
SHEATH ACCESS URETERAL 54CM (SHEATH) IMPLANT
STENT URET 6FRX24 CONTOUR (STENTS) ×3 IMPLANT
UROMAX ULTRA KIT 15FX4CM ×3 IMPLANT

## 2014-02-28 NOTE — Interval H&P Note (Signed)
History and Physical Interval Note:  02/28/2014 8:23 AM  Angela Santos  has presented today for surgery, with the diagnosis of RIGHT RENAL STONE  The various methods of treatment have been discussed with the patient and family. After consideration of risks, benefits and other options for treatment, the patient has consented to  Procedure(s): CYSTOSCOPY WITH BILATERAL  RETROGRADE RIGHT URETEROSCOPY WITH HOLMIUM LASER AND STONE EXTRACTION WITH STENTING (Bilateral) HOLMIUM LASER APPLICATION (Right) as a surgical intervention .  The patient's history has been reviewed, patient examined, no change in status, stable for surgery.  I have reviewed the patient's chart and labs.  Questions were answered to the patient's satisfaction.     [name]

## 2014-02-28 NOTE — Transfer of Care (Signed)
Immediate Anesthesia Transfer of Care Note  Patient: Angela Santos  Procedure(s) Performed: Procedure(s) (LRB): CYSTOSCOPY WITH BILATERAL  RETROGRADE RIGHT URETEROSCOPY WITH HOLMIUM LASER AND STONE EXTRACTION WITH RIGHT URETERAL STENTING, UPJ BALLOON DILATION (Bilateral) HOLMIUM LASER APPLICATION (Right)  Patient Location: PACU  Anesthesia Type: General  Level of Consciousness: awake, alert  and oriented  Airway & Oxygen Therapy: Patient Spontanous Breathing and Patient connected to face mask oxygen  Post-op Assessment: Report given to PACU RN and Post -op Vital signs reviewed and stable  Post vital signs: Reviewed and stable  Complications: No apparent anesthesia complications

## 2014-02-28 NOTE — Op Note (Signed)
Preoperative diagnosis: Right calculus  Postoperative diagnosis: Right calculus and right UPJ narrowing  Procedure:  1. Cystoscopy 2. Right ureteroscopy and stone removal 3. Ureteroscopic laser lithotripsy 4. Right ureteral stent placement (6Fr x 24 cm) 5. Bilateral retrograde pyelography with interpretation 6. Right balloon dilation of UPJ narrowing  Surgeon: Bjorn PippinJohn Kasarah Sitts, MD  Resident: Adela LankE. Will Kirby, MD  Anesthesia: General  Complications: None  Intraoperative findings: Right retrograde pyelography demonstrated narrowing of right UPJ and blunting of calices which were both confirmed under direct vision.  Left RPG normal.  3 stone in right lower pole fragmented.  Incomplete basket extraction due to difficulty with visualization.  Balloon dilation of right UPJ.    EBL: Minimal  Specimens: 1. Right calculus  Disposition of specimens: [facility] for stone analysis  Indication: Angela Santos Castorena is a 48 y.o. female patient with urolithiasis. After reviewing the management options for treatment, they elected to proceed with the above surgical procedure(s). We have discussed the potential benefits and risks of the procedure, side effects of the proposed treatment, the likelihood of the patient achieving the goals of the procedure, and any potential problems that might occur during the procedure or recuperation. Informed consent has been obtained.  Description of procedure:  The patient was taken to the operating room and general anesthesia was induced.  The patient was placed in the dorsal lithotomy position, prepped and draped in the usual sterile fashion, and preoperative antibiotics were administered. A preoperative time-out was performed.   Cystourethroscopy was performed.  The patient's urethra was examined and was normal. The bladder was then systematically examined in its entirety. There was no evidence for any bladder tumors, stones, or other mucosal pathology.     Attention then turned to the bilateral ureteral orifices and a ureteral catheter was used to intubate the ureteral orifice.  Omnipaque contrast was injected through the ureteral catheter and a retrograde pyelogram was performed with findings as dictated above.  Left retrograde pyelogram was normal with a delicate ureter and intrarenal collecting system with no filling defects.  Right retrograde pyelogram demonstrated a normal ureter to just below the UPJ where there was a 1cm narrowing consistent with stricture.  There was mild dilation of the collecting system with blunting of the calyces and a filling defect in the lower pole consistent with her known stone.   A 0.38 sensor guidewire was then advanced up the Right ureter into the renal pelvis under fluoroscopic guidance.  A 12/14 Fr ureteral access sheath was then advanced over the guide wire. The digital flexible ureteroscope was then advanced through the access sheath into the ureter next to the guidewire and the calculus was identified and was located in the lower pole.  In this process we also appreciated that the right UPJ was blanched and narrow.  The stones were first repositioned into the upper pole for better access.  The stones were then fragmented with the 200 micron holmium laser fiber.   A few of the sizable stones were then removed with a basket.  Reinspection of the ureter/renal pelvis revealed difficulty with visualization due to bleeding and we were unable to identify any remaining stones.    The safety wire was then replaced and the access sheath removed.  The guidewire was backloaded through the cystoscope and a ureteral balloon dilator was utilized to balloon dilate the UPJ.  A ureteral stent was then advanced over the wire using Seldinger technique.  The stent was positioned appropriately under fluoroscopic and cystoscopic  guidance.  The wire was then removed with an adequate stent curl noted in the renal pelvis as well as in  the bladder.  The bladder was then emptied and the procedure ended.  The patient appeared to tolerate the procedure well and without complications.  The patient was able to be awakened and transferred to the recovery unit in satisfactory condition.

## 2014-02-28 NOTE — Discharge Instructions (Addendum)
1. You may see some blood in the urine and may have some burning with urination for 48-72 hours. You also may notice that you have to urinate more frequently or urgently after your procedure which is normal.  2. You should call should you develop an inability urinate, fever > 101, persistent nausea and vomiting that prevents you from eating or drinking to stay hydrated.  3. You have a stent. you will likely urinate more frequently and urgently until the stent is removed and you may experience some discomfort/pain in the lower abdomen and flank especially when urinating. You may take pain medication prescribed to you if needed for pain. You may also intermittently have blood in the urine until the stent is removed. It is essential that you follow up for stent removal as instructed. If the stent is left in place, this could result in permanent damage to and even loss of the kidney, as well as other complications like infections and kidney stones.    CYSTOSCOPY HOME CARE INSTRUCTIONS  Activity: Rest for the remainder of the day.  Do not drive or operate equipment today.  You may resume normal activities in one to two days as instructed by your physician.   Meals: Drink plenty of liquids and eat light foods such as gelatin or soup this evening.  You may return to a normal meal plan tomorrow.  Return to Work: You may return to work in one to two days or as instructed by your physician.  Special Instructions / Symptoms: Call your physician if any of these symptoms occur:   -persistent or heavy bleeding  -bleeding which continues after first few urination  -large blood clots that are difficult to pass  -urine stream diminishes or stops completely  -fever equal to or higher than 101 degrees Farenheit.  -cloudy urine with a strong, foul odor  -severe pain  Females should always wipe from front to back after elimination.  You may feel some burning pain when you urinate.  This should disappear with  time.  Applying moist heat to the lower abdomen or a hot tub bath may help relieve the pain. \   [facility] [phone] Post Ureteroscopy With or Without Stent Instructions  Definitions:  Ureter: The duct that transports urine from the kidney to the bladder. Stent:   A plastic hollow tube that is placed into the ureter, from the kidney to the                 bladder to prevent the ureter from swelling shut.  GENERAL INSTRUCTIONS:  Despite the fact that no skin incisions were used, the area around the ureter and bladder is raw and irritated. The stent is a foreign body which will further irritate the bladder wall. This irritation is manifested by increased frequency of urination, both day and night, and by an increase in the urge to urinate. In some, the urge to urinate is present almost always. Sometimes the urge is strong enough that you may not be able to stop yourself from urinating. The only real cure is to remove the stent and then give time for the bladder wall to heal which can't be done until the danger of the ureter swelling shut has passed, which varies.  You may see some blood in your urine while the stent is in place and a few days afterwards. Do not be alarmed, even if the urine was clear for a while. Get off your feet and drink lots of  fluids until clearing occurs. If you start to pass clots or don't improve, call us.  DIET: You may return to your normal diet immediately. Because of the raw surface of your bladder, alcohol, spicy foods, acid type foods and drinks with caffeine may cause irritation or frequency and should be used in moderation. To keep your urine flowing freely and to avoid constipation, drink plenty of fluids during the day ( 8-10 glasses ). Tip: Avoid cranberry juice because it is very acidic.  ACTIVITY: Your physical activity doesn't need to be restricted. However, if you are very active, you may see some blood in your urine. We suggest that  you reduce your activity under these circumstances until the bleeding has stopped.  BOWELS: It is important to keep your bowels regular during the postoperative period. Straining with bowel movements can cause bleeding. A bowel movement every other day is reasonable. Use a mild laxative if needed, such as Milk of Magnesia 2-3 tablespoons, or 2 Dulcolax tablets. Call if you continue to have problems. If you have been taking narcotics for pain, before, during or after your surgery, you may be constipated. Take a laxative if necessary.   MEDICATION: You should resume your pre-surgery medications unless told not to. In addition you will often be given an antibiotic to prevent infection. These should be taken as prescribed until the bottles are finished unless you are having an unusual reaction to one of the drugs.  PROBLEMS YOU SHOULD REPORT TO US:  Fevers over 100.5 Fahrenheit.  Heavy bleeding, or clots ( See above notes about blood in urine ).  Inability to urinate.  Drug reactions ( hives, rash, nausea, vomiting, diarrhea ).  Severe burning or pain with urination that is not improving.  FOLLOW-UP: You will need a follow-up appointment to monitor your progress. Call for this appointment at the number listed above. Usually the first appointment will be about three to fourteen days after your surgery.     Post Anesthesia Home Care Instructions  Activity: Get plenty of rest for the remainder of the day. A responsible adult should stay with you for 24 hours following the procedure.  For the next 24 hours, DO NOT: -Drive a car -Advertising copywriterperate machinery -Drink alcoholic beverages -Take any medication unless instructed by your physician -Make any legal decisions or sign important papers.  Meals: Start with liquid foods such as gelatin or soup. Progress to regular foods as tolerated. Avoid greasy, spicy, heavy foods. If nausea and/or vomiting occur, drink only clear liquids until the nausea  and/or vomiting subsides. Call your physician if vomiting continues.  Special Instructions/Symptoms: Your throat may feel dry or sore from the anesthesia or the breathing tube placed in your throat during surgery. If this causes discomfort, gargle with warm salt water. The discomfort should disappear within 24 hours.

## 2014-02-28 NOTE — Anesthesia Procedure Notes (Signed)
Procedure Name: LMA Insertion Date/Time: 02/28/2014 8:43 AM Performed by: Norva PavlovALLAWAY, Angela Nicklas G Pre-anesthesia Checklist: Patient identified, Emergency Drugs available, Suction available and Patient being monitored Patient Re-evaluated:Patient Re-evaluated prior to inductionOxygen Delivery Method: Circle System Utilized Preoxygenation: Pre-oxygenation with 100% oxygen Intubation Type: IV induction Ventilation: Mask ventilation without difficulty LMA: LMA inserted LMA Size: 3.0 Number of attempts: 1 Airway Equipment and Method: bite block Placement Confirmation: positive ETCO2 Tube secured with: Tape Dental Injury: Teeth and Oropharynx as per pre-operative assessment

## 2014-03-01 ENCOUNTER — Encounter (HOSPITAL_BASED_OUTPATIENT_CLINIC_OR_DEPARTMENT_OTHER): Payer: Self-pay | Admitting: Urology

## 2014-03-01 ENCOUNTER — Other Ambulatory Visit: Payer: Self-pay | Admitting: Urology

## 2014-03-01 NOTE — Anesthesia Postprocedure Evaluation (Signed)
  Anesthesia Post-op Note  Patient: Janeece FittingSandra W Inscoe  Procedure(s) Performed: Procedure(s) (LRB): CYSTOSCOPY WITH BILATERAL  RETROGRADE RIGHT URETEROSCOPY WITH HOLMIUM LASER AND STONE EXTRACTION WITH RIGHT URETERAL STENTING, UPJ BALLOON DILATION (Bilateral) HOLMIUM LASER APPLICATION (Right)  Patient Location: PACU  Anesthesia Type: General  Level of Consciousness: awake and alert   Airway and Oxygen Therapy: Patient Spontanous Breathing  Post-op Pain: mild  Post-op Assessment: Post-op Vital signs reviewed, Patient's Cardiovascular Status Stable, Respiratory Function Stable, Patent Airway and No signs of Nausea or vomiting  Last Vitals:  Filed Vitals:   02/28/14 1136  BP: 103/55  Pulse: 58  Temp: 36.6 C  Resp: 16    Post-op Vital Signs: stable   Complications: No apparent anesthesia complications

## 2014-03-11 ENCOUNTER — Other Ambulatory Visit: Payer: Self-pay | Admitting: Urology

## 2014-03-11 ENCOUNTER — Encounter (HOSPITAL_COMMUNITY): Payer: Self-pay | Admitting: *Deleted

## 2014-03-11 NOTE — Progress Notes (Signed)
Called spoke with Pam and requested orders be put into Epic under sign and held for same day surgery tomorrow 03-13-14

## 2014-03-12 ENCOUNTER — Encounter (HOSPITAL_COMMUNITY): Payer: Self-pay | Admitting: *Deleted

## 2014-03-12 ENCOUNTER — Ambulatory Visit (HOSPITAL_COMMUNITY): Payer: PRIVATE HEALTH INSURANCE | Admitting: Anesthesiology

## 2014-03-12 ENCOUNTER — Encounter (HOSPITAL_COMMUNITY)
Admission: RE | Disposition: A | Discharge status: Home / Self Care | Payer: Self-pay | Source: Ambulatory Visit | Attending: Urology

## 2014-03-12 ENCOUNTER — Ambulatory Visit (HOSPITAL_COMMUNITY)
Admission: RE | Admit: 2014-03-12 | Discharge: 2014-03-12 | Disposition: A | Discharge status: Home / Self Care | Payer: PRIVATE HEALTH INSURANCE | Source: Ambulatory Visit | Attending: Urology | Admitting: Urology

## 2014-03-12 DIAGNOSIS — R312 Other microscopic hematuria: Secondary | ICD-10-CM | POA: Diagnosis not present

## 2014-03-12 DIAGNOSIS — F329 Major depressive disorder, single episode, unspecified: Secondary | ICD-10-CM | POA: Diagnosis not present

## 2014-03-12 DIAGNOSIS — N135 Crossing vessel and stricture of ureter without hydronephrosis: Secondary | ICD-10-CM | POA: Insufficient documentation

## 2014-03-12 DIAGNOSIS — N200 Calculus of kidney: Secondary | ICD-10-CM | POA: Diagnosis present

## 2014-03-12 DIAGNOSIS — E669 Obesity, unspecified: Secondary | ICD-10-CM | POA: Diagnosis not present

## 2014-03-12 DIAGNOSIS — Z6830 Body mass index (BMI) 30.0-30.9, adult: Secondary | ICD-10-CM | POA: Insufficient documentation

## 2014-03-12 DIAGNOSIS — N390 Urinary tract infection, site not specified: Secondary | ICD-10-CM | POA: Diagnosis not present

## 2014-03-12 DIAGNOSIS — F419 Anxiety disorder, unspecified: Secondary | ICD-10-CM | POA: Insufficient documentation

## 2014-03-12 HISTORY — DX: Nausea with vomiting, unspecified: R11.2

## 2014-03-12 HISTORY — DX: Other specified postprocedural states: Z98.890

## 2014-03-12 HISTORY — PX: CYSTOSCOPY/RETROGRADE/URETEROSCOPY/STONE EXTRACTION WITH BASKET: SHX5317

## 2014-03-12 LAB — CBC
HEMATOCRIT: 38 % (ref 36.0–46.0)
HEMOGLOBIN: 13.3 g/dL (ref 12.0–15.0)
MCH: 32.3 pg (ref 26.0–34.0)
MCHC: 35 g/dL (ref 30.0–36.0)
MCV: 92.2 fL (ref 78.0–100.0)
Platelets: 211 10*3/uL (ref 150–400)
RBC: 4.12 MIL/uL (ref 3.87–5.11)
RDW: 12.1 % (ref 11.5–15.5)
WBC: 7.9 10*3/uL (ref 4.0–10.5)

## 2014-03-12 LAB — GLUCOSE, CAPILLARY: Glucose-Capillary: 92 mg/dL (ref 70–99)

## 2014-03-12 LAB — PREGNANCY, URINE: Preg Test, Ur: NEGATIVE

## 2014-03-12 SURGERY — CYSTOSCOPY, WITH CALCULUS REMOVAL USING BASKET
Anesthesia: General | Laterality: Right

## 2014-03-12 MED ORDER — LACTATED RINGERS IV SOLN
INTRAVENOUS | Status: DC
Start: 2014-03-12 — End: 2014-03-12
  Administered 2014-03-12: 1000 mL via INTRAVENOUS
  Administered 2014-03-12: 16:00:00 via INTRAVENOUS

## 2014-03-12 MED ORDER — OXYCODONE HCL 5 MG PO TABS: 5.0000 mg | ORAL_TABLET | ORAL | Status: DC | PRN

## 2014-03-12 MED ORDER — NEOSTIGMINE METHYLSULFATE 10 MG/10ML IV SOLN
INTRAVENOUS | Status: DC | PRN
  Administered 2014-03-12: 2 mg via INTRAVENOUS

## 2014-03-12 MED ORDER — SODIUM CHLORIDE 0.9 % IV SOLN: 250.0000 mL | INTRAVENOUS | Status: DC | PRN

## 2014-03-12 MED ORDER — ACETAMINOPHEN 325 MG PO TABS: 650.0000 mg | ORAL_TABLET | ORAL | Status: DC | PRN

## 2014-03-12 MED ORDER — FENTANYL CITRATE 0.05 MG/ML IJ SOLN
25.0000 ug | INTRAMUSCULAR | Status: DC | PRN
  Administered 2014-03-12 (×2): 50 ug via INTRAVENOUS

## 2014-03-12 MED ORDER — BELLADONNA ALKALOIDS-OPIUM 16.2-60 MG RE SUPP
RECTAL | Status: DC | PRN
  Administered 2014-03-12: 1 via RECTAL

## 2014-03-12 MED ORDER — NEOSTIGMINE METHYLSULFATE 10 MG/10ML IV SOLN
INTRAVENOUS | Status: AC
  Filled 2014-03-12: qty 1

## 2014-03-12 MED ORDER — ONDANSETRON HCL 4 MG/2ML IJ SOLN
INTRAMUSCULAR | Status: AC
  Filled 2014-03-12: qty 2

## 2014-03-12 MED ORDER — IOHEXOL 300 MG/ML  SOLN
INTRAMUSCULAR | Status: DC | PRN
  Administered 2014-03-12: 10 mL

## 2014-03-12 MED ORDER — SCOPOLAMINE 1 MG/3DAYS TD PT72
1.0000 | MEDICATED_PATCH | TRANSDERMAL | Status: DC
  Administered 2014-03-12: 1.5 mg via TRANSDERMAL
  Filled 2014-03-12: qty 1

## 2014-03-12 MED ORDER — CISATRACURIUM BESYLATE (PF) 10 MG/5ML IV SOLN
INTRAVENOUS | Status: DC | PRN
  Administered 2014-03-12: 4 mg via INTRAVENOUS

## 2014-03-12 MED ORDER — SODIUM CHLORIDE 0.9 % IJ SOLN: 3.0000 mL | INTRAMUSCULAR | Status: DC | PRN

## 2014-03-12 MED ORDER — LIDOCAINE HCL (CARDIAC) 20 MG/ML IV SOLN
INTRAVENOUS | Status: AC
  Filled 2014-03-12: qty 5

## 2014-03-12 MED ORDER — ONDANSETRON HCL 4 MG/2ML IJ SOLN
4.0000 mg | Freq: Once | INTRAMUSCULAR | Status: AC
  Administered 2014-03-12: 4 mg via INTRAVENOUS

## 2014-03-12 MED ORDER — METOCLOPRAMIDE HCL 5 MG/ML IJ SOLN
INTRAMUSCULAR | Status: DC | PRN
  Administered 2014-03-12: 5 mg via INTRAVENOUS

## 2014-03-12 MED ORDER — SODIUM CHLORIDE 0.9 % IR SOLN
Status: DC | PRN
  Administered 2014-03-12: 1000 mL

## 2014-03-12 MED ORDER — CIPROFLOXACIN IN D5W 400 MG/200ML IV SOLN
INTRAVENOUS | Status: AC
  Filled 2014-03-12: qty 200

## 2014-03-12 MED ORDER — MIDAZOLAM HCL 2 MG/2ML IJ SOLN
INTRAMUSCULAR | Status: AC
  Filled 2014-03-12: qty 2

## 2014-03-12 MED ORDER — LIDOCAINE HCL (CARDIAC) 20 MG/ML IV SOLN
INTRAVENOUS | Status: DC | PRN
  Administered 2014-03-12: 80 mg via INTRAVENOUS

## 2014-03-12 MED ORDER — PROPOFOL 10 MG/ML IV BOLUS
INTRAVENOUS | Status: AC
  Filled 2014-03-12: qty 20

## 2014-03-12 MED ORDER — BELLADONNA ALKALOIDS-OPIUM 16.2-60 MG RE SUPP
RECTAL | Status: AC
  Filled 2014-03-12: qty 1

## 2014-03-12 MED ORDER — DEXAMETHASONE SODIUM PHOSPHATE 10 MG/ML IJ SOLN
INTRAMUSCULAR | Status: DC | PRN
  Administered 2014-03-12: 10 mg via INTRAVENOUS

## 2014-03-12 MED ORDER — ONDANSETRON HCL 4 MG/2ML IJ SOLN
INTRAMUSCULAR | Status: DC | PRN
  Administered 2014-03-12: 4 mg via INTRAVENOUS

## 2014-03-12 MED ORDER — ACETAMINOPHEN 650 MG RE SUPP
650.0000 mg | RECTAL | Status: DC | PRN
  Filled 2014-03-12: qty 1

## 2014-03-12 MED ORDER — FENTANYL CITRATE 0.05 MG/ML IJ SOLN
50.0000 ug | INTRAMUSCULAR | Status: DC | PRN
  Administered 2014-03-12 (×2): 50 ug via INTRAVENOUS

## 2014-03-12 MED ORDER — CIPROFLOXACIN IN D5W 400 MG/200ML IV SOLN
400.0000 mg | INTRAVENOUS | Status: AC
  Administered 2014-03-12: 400 mg via INTRAVENOUS

## 2014-03-12 MED ORDER — FENTANYL CITRATE 0.05 MG/ML IJ SOLN: 25.0000 ug | INTRAMUSCULAR | Status: DC | PRN

## 2014-03-12 MED ORDER — GLYCOPYRROLATE 0.2 MG/ML IJ SOLN
INTRAMUSCULAR | Status: DC | PRN
  Administered 2014-03-12: .4 mg via INTRAVENOUS
  Administered 2014-03-12: 0.2 mg via INTRAVENOUS

## 2014-03-12 MED ORDER — SODIUM CHLORIDE 0.9 % IJ SOLN: 3.0000 mL | Freq: Two times a day (BID) | INTRAMUSCULAR | Status: DC

## 2014-03-12 MED ORDER — PROPOFOL 10 MG/ML IV BOLUS
INTRAVENOUS | Status: DC | PRN
  Administered 2014-03-12: 40 mg via INTRAVENOUS
  Administered 2014-03-12: 160 mg via INTRAVENOUS

## 2014-03-12 MED ORDER — SCOPOLAMINE 1 MG/3DAYS TD PT72
MEDICATED_PATCH | TRANSDERMAL | Status: DC | PRN
  Administered 2014-03-12: 1 via TRANSDERMAL

## 2014-03-12 MED ORDER — FENTANYL CITRATE 0.05 MG/ML IJ SOLN
INTRAMUSCULAR | Status: DC
Start: 2014-03-12 — End: 2014-03-12
  Filled 2014-03-12: qty 2

## 2014-03-12 MED ORDER — MIDAZOLAM HCL 5 MG/5ML IJ SOLN
INTRAMUSCULAR | Status: DC | PRN
  Administered 2014-03-12: 2 mg via INTRAVENOUS

## 2014-03-12 MED ORDER — HYDROCODONE-ACETAMINOPHEN 5-325 MG PO TABS: 1.0000 | ORAL_TABLET | Freq: Four times a day (QID) | ORAL | Status: DC | PRN

## 2014-03-12 MED ORDER — SUCCINYLCHOLINE CHLORIDE 20 MG/ML IJ SOLN
INTRAMUSCULAR | Status: DC | PRN
  Administered 2014-03-12: 100 mg via INTRAVENOUS

## 2014-03-12 MED ORDER — FENTANYL CITRATE 0.05 MG/ML IJ SOLN
INTRAMUSCULAR | Status: AC
  Filled 2014-03-12: qty 2

## 2014-03-12 MED ORDER — CISATRACURIUM BESYLATE 20 MG/10ML IV SOLN
INTRAVENOUS | Status: AC
  Filled 2014-03-12: qty 10

## 2014-03-12 MED ORDER — FENTANYL CITRATE 0.05 MG/ML IJ SOLN
INTRAMUSCULAR | Status: DC | PRN
  Administered 2014-03-12: 50 ug via INTRAVENOUS
  Administered 2014-03-12: 100 ug via INTRAVENOUS
  Administered 2014-03-12 (×2): 50 ug via INTRAVENOUS

## 2014-03-12 MED ORDER — FENTANYL CITRATE 0.05 MG/ML IJ SOLN
INTRAMUSCULAR | Status: AC
  Filled 2014-03-12: qty 5

## 2014-03-12 MED ORDER — SCOPOLAMINE 1 MG/3DAYS TD PT72
MEDICATED_PATCH | TRANSDERMAL | Status: AC
  Filled 2014-03-12: qty 1

## 2014-03-12 MED ORDER — GLYCOPYRROLATE 0.2 MG/ML IJ SOLN
INTRAMUSCULAR | Status: AC
  Filled 2014-03-12: qty 2

## 2014-03-12 MED ORDER — PROMETHAZINE HCL 25 MG/ML IJ SOLN: 6.2500 mg | INTRAMUSCULAR | Status: DC | PRN

## 2014-03-12 SURGICAL SUPPLY — 13 items
DRAPE CAMERA CLOSED 9X96 (DRAPES) ×2 IMPLANT
EXTRACTOR STONE NITINOL NGAGE (UROLOGICAL SUPPLIES) ×1 IMPLANT
FIBER LASER FLEXIVA 1000 (UROLOGICAL SUPPLIES) ×1 IMPLANT
FIBER LASER FLEXIVA 200 (UROLOGICAL SUPPLIES) ×1 IMPLANT
FIBER LASER FLEXIVA 365 (UROLOGICAL SUPPLIES) ×1 IMPLANT
FIBER LASER FLEXIVA 550 (UROLOGICAL SUPPLIES) ×1 IMPLANT
FIBER LASER TRAC TIP (UROLOGICAL SUPPLIES) IMPLANT
GLOVE SURG SS PI 8.0 STRL IVOR (GLOVE) ×1 IMPLANT
GOWN SPEC L3 XXLG W/TWL (GOWN DISPOSABLE) ×2 IMPLANT
GUIDEWIRE STR DUAL SENSOR (WIRE) ×1 IMPLANT
PACK CYSTO (CUSTOM PROCEDURE TRAY) ×2 IMPLANT
SHEATH ACCESS URETERAL 38CM (SHEATH) ×1 IMPLANT
STENT URET 6FRX24 CONTOUR (STENTS) ×1 IMPLANT

## 2014-03-12 NOTE — Transfer of Care (Signed)
Immediate Anesthesia Transfer of Care Note  Patient: Angela Santos  Procedure(s) Performed: Procedure(s): CYSTOSCOPY/RETROGRADE/URETEROSCOPY/STONE EXTRACTION WITH BASKET (Right)  Patient Location: PACU  Anesthesia Type:General  Level of Consciousness: awake, alert , oriented, patient cooperative and responds to stimulation  Airway & Oxygen Therapy: Patient Spontanous Breathing and Patient connected to face mask oxygen  Post-op Assessment: Report given to PACU RN, Post -op Vital signs reviewed and stable and Patient moving all extremities  Post vital signs: Reviewed and stable  Complications: No apparent anesthesia complications

## 2014-03-12 NOTE — Anesthesia Preprocedure Evaluation (Signed)
Anesthesia Evaluation  Patient identified by MRN, date of birth, ID band Patient awake    Reviewed: Allergy & Precautions, H&P , NPO status , Patient's Chart, lab work & pertinent test results  History of Anesthesia Complications (+) PONV and history of anesthetic complications  Airway Mallampati: II  TM Distance: >3 FB Neck ROM: Full    Dental no notable dental hx.    Pulmonary Current Smoker,  breath sounds clear to auscultation  Pulmonary exam normal       Cardiovascular Exercise Tolerance: Good - dysrhythmias Rhythm:Regular Rate:Normal  ECG: SB,  iRBBB   Neuro/Psych PSYCHIATRIC DISORDERS Anxiety Depression negative neurological ROS     GI/Hepatic negative GI ROS, Neg liver ROS,   Endo/Other  negative endocrine ROS  Renal/GU Renal disease  negative genitourinary   Musculoskeletal negative musculoskeletal ROS (+)   Abdominal (+) + obese,   Peds negative pediatric ROS (+)  Hematology negative hematology ROS (+)   Anesthesia Other Findings   Reproductive/Obstetrics negative OB ROS                             Anesthesia Physical Anesthesia Plan  ASA: II and emergent  Anesthesia Plan: General   Post-op Pain Management:    Induction: Intravenous  Airway Management Planned: Oral ETT  Additional Equipment:   Intra-op Plan:   Post-operative Plan: Extubation in OR  Informed Consent: I have reviewed the patients History and Physical, chart, labs and discussed the procedure including the risks, benefits and alternatives for the proposed anesthesia with the patient or authorized representative who has indicated his/her understanding and acceptance.   Dental advisory given  Plan Discussed with: CRNA  Anesthesia Plan Comments:         Anesthesia Quick Evaluation

## 2014-03-12 NOTE — H&P (View-Only) (Signed)
Active Problems Problems  1. Calculus of left ureter (N20.1) 2. History of kidney stones (Z87.442) 3. Microscopic hematuria (R31.2) 4. Nephrolithiasis (N20.0) 5. Urinary tract infection (N39.0)  History of Present Illness Angela DavenportSandra returns today in f/u for her history of stone disease. She is having right groin pain that is moderate and she has had some AM nausea. The pain has been there for about 5 days.  She had a right renal ESWL on 09/20/13 and has a 7-628mm fragment in the RUP and a 9mm cluster in the RLP. She has TNTC RBC's today on UA. She has no associated signs or symptoms.   Past Medical History Problems  1. History of Calculus of right ureter (N20.1) 2. History of hematuria (Z87.448) 3. History of kidney stones (Z87.442) 4. History of Nephrolithiasis Of The Left Kidney  Surgical History Problems  1. History of Cystoscopy With Insertion Of Ureteral Stent Left 2. History of Lithotripsy 3. History of Lithotripsy 4. History of Lithotripsy 5. History of Lithotripsy  Current Meds 1. Paxil 20 MG Oral Tablet;  Therapy: (Recorded:02Feb2015) to Recorded  Allergies Medication  1. Penicillins  Family History Problems  1. Family history of Acute Myocardial Infarction : Father 2. Family history of Death In The Family Father : Mother   2763- MI 3. Family history of Family Health Status - Mother's Age   49 4. Family history of Family Health Status Number Of Children   1 son (12) 5. Family history of Heart Disease : Mother 6. Family history of Hematuria : Mother 417. Family history of Nephrolithiasis : Mother 828. Family history of Nephrolithiasis : Father  Social History Problems  1. Denied: History of Alcohol Use 2. Caffeine Use   2 per day 3. Current every day smoker (F17.200) 4. Marital History - Currently Married 5. Occupation:   Sec.- [employer] 6. Tobacco Use   smoked 1ppd for 25 yrs & quit 11/16/06  Review of Systems  Genitourinary: hematuria, but no  urinary frequency and no urinary urgency.  Gastrointestinal: nausea and abdominal pain.  Constitutional: no fever.    Vitals Vital Signs [Data Includes: Last 1 Day]  Recorded: 28Oct2015 03:37PM  Blood Pressure: 97 / 63 Temperature: 98.5 F Heart Rate: 69  Results/Data Urine [Data Includes: Last 1 Day]   28Oct2015  COLOR YELLOW   APPEARANCE CLOUDY   SPECIFIC GRAVITY <1.005   pH 7.0   GLUCOSE NEG mg/dL  BILIRUBIN NEG   KETONE NEG mg/dL  BLOOD LARGE   PROTEIN NEG mg/dL  UROBILINOGEN 1 mg/dL  NITRITE NEG   LEUKOCYTE ESTERASE NEG   SQUAMOUS EPITHELIAL/HPF RARE   WBC 0-2 WBC/hpf  RBC TNTC RBC/hpf  BACTERIA MANY   CRYSTALS NONE SEEN   CASTS NONE SEEN   Other SEE NOTE    The following images/tracing/specimen were independently visualized:  KUB today shows an 8mm RUP fragment and a 9mm RLP cluster of fragments. I don't see any left renal stones. No ureteral stones are seen. She has a phlebolith in the right pelvis. She has minimal lower lumbar disc disease. No other abnormalities are noted.  The following clinical lab reports were reviewed:  UA reviewed.    Assessment Assessed  1. Microscopic hematuria (R31.2) 2. Nephrolithiasis (N20.0)  She has hematuria and right sided pain but I only see renal stones on KUB.   Plan Health Maintenance  1. UA With REFLEX; [Do Not Release]; Status:Resulted - Requires Verification;   Done:  28Oct2015 03:02PM Microscopic hematuria  2. URINE CULTURE;  Status:Hold For - Specimen/Data Collection,Appointment; Requested  for:28Oct2015;  Nephrolithiasis  3. AU CT-STONE PROTOCOL; Status:Hold For - Appointment,PreCert,Date of Service,Print;  Requested for:28Oct2015;  4. Follow-up Office  Follow-up  Status: Hold For - Appointment,Date of Service  Requested  for: 28Oct2015  I am going to get a CT urogram to evaluate her complaints.  I will call her with the results and arrange f/u accordingly.   She has stones bilaterally with the bulk on the  right and needs further evaluation of her hematuria.  I have reviewed the options with her and will get her set up for cystoscopy with bilateral RTG's and right ureteroscopy with holmium and stone extraction.  I reviewed the risks of bleeding, infection, ureteral injury, need for stent and secondary procedures, thrombotic events and anesthetic complications.

## 2014-03-12 NOTE — Brief Op Note (Signed)
03/12/2014  2:42 PM  PATIENT:  Angela FittingSandra W Santos  48 y.o. female  PRE-OPERATIVE DIAGNOSIS:  RIGHT RENAL STONES  POST-OPERATIVE DIAGNOSIS:  right renal stones   PROCEDURE:  Procedure(s): CYSTOSCOPY WITH RIGHT  URETEROSCOPY AND HOLMIUM LASER,STONE EXTRACTION  STENT PLACEMENT (Right) HOLMIUM LASER APPLICATION (Right)  SURGEON:  Surgeon(s) and Role:    * Anner CreteJohn J Breawna Montenegro, MD - Primary  PHYSICIAN ASSISTANT:   ASSISTANTS: none   ANESTHESIA:   general  EBL:     BLOOD ADMINISTERED:none  DRAINS: 6 x 24 fr right JJ stent   LOCAL MEDICATIONS USED:  NONE  SPECIMEN:  Source of Specimen:  stone fragments  DISPOSITION OF SPECIMEN:  to patient  COUNTS:  YES  TOURNIQUET:  * No tourniquets in log *  DICTATION: .Other Dictation: Dictation Number [id number]  PLAN OF CARE: Discharge to home after PACU  PATIENT DISPOSITION:  PACU - hemodynamically stable.   Delay start of Pharmacological VTE agent (>24hrs) due to surgical blood loss or risk of bleeding: not applicable

## 2014-03-12 NOTE — Discharge Instructions (Addendum)
Ureteral Stent Implantation °Ureteral stent implantation is the implantation of a soft plastic tube with multiple holes into the tube that drains urine from your kidney to your bladder (ureter). The stent helps drain your kidney when there is a blockage of the flow of urine in your ureter. The stent has a coil on each end to keep it from falling out. One end stays in the kidney. The other end stays in the bladder. It is most often taken out after any blockage has been removed or your ureter has healed. Short-term stents have a string attached to make removal quite easy. Removal of a short-term stent can be done in your health care provider's office or by you at home. Long-term stents need to be changed every few months. °LET YOUR HEALTH CARE PROVIDER KNOW ABOUT: °· Any allergies you have. °· All medicines you are taking, including vitamins, herbs, eye drops, creams, and over-the-counter medicines. °· Previous problems you or members of your family have had with the use of anesthetics. °· Any blood disorders you have. °· Previous surgeries you have had. °· Medical conditions you have. °RISKS AND COMPLICATIONS °Generally, ureteral stent implantation is a safe procedure. However, as with any procedure, complications can occur. Possible complications include: °· Movement of the stent away from where it was originally placed (migration). This may affect the ability of the stent to properly drain your kidney. If migration of the stent occurs, the stent may need to be replaced or repositioned. °· Perforation of the ureter.  °· Infection. °BEFORE THE PROCEDURE °· You may be asked to wash your genital area with sterile soap the morning of your procedure. °· You may be given an oral antibiotic which you should take with a sip of water as prescribed by your health care provider. °· You may be asked to not eat or drink for 8 hours before the surgery. °PROCEDURE °· First you will be given an anesthetic so you do not feel pain  during the procedure. °· Your health care provider will insert a special lighted instrument called a cystoscope into your bladder. This allows your health care provider to see the opening to your ureter. °· A thin wire is carefully threaded into your bladder and up the ureter. The stent is inserted over the wire and the wire is then removed. °· Your bladder will be emptied of urine. °AFTER THE PROCEDURE °You will be taken to a recovery room until it is okay for you to go home. °Document Released: 04/02/2000 Document Revised: 04/10/2013 Document Reviewed: 09/12/2012 °Exit[employer]® Patient Information ©2015 [employer]. This information is not intended to replace advice given to you by your health care provider. Make sure you discuss any questions you have with your health care provider. ° ° °You may pull the stent by the attached string on Friday morning.  ° ° ° ° ° °General Anesthesia, Care After °Refer to this sheet in the next few weeks. These instructions provide you with information on caring for yourself after your procedure. Your health care provider may also give you more specific instructions. Your treatment has been planned according to current medical practices, but problems sometimes occur. Call your health care provider if you have any problems or questions after your procedure. °WHAT TO EXPECT AFTER THE PROCEDURE °After the procedure, it is typical to experience: °· Sleepiness. °· Nausea and vomiting. °HOME CARE INSTRUCTIONS °· For the first 24 hours after general anesthesia: °¨ Have a responsible person with you. °¨ Do not drive   a car. If you are alone, do not take public transportation. °¨ Do not drink alcohol. °¨ Do not take medicine that has not been prescribed by your health care provider. °¨ Do not sign important papers or make important decisions. °¨ You may resume a normal diet and activities as directed by your health care provider. °· Change bandages (dressings) as directed. °· If you have  questions or problems that seem related to general anesthesia, call the hospital and ask for the anesthetist or anesthesiologist on call. °SEEK MEDICAL CARE IF: °· You have nausea and vomiting that continue the day after anesthesia. °· You develop a rash. °SEEK IMMEDIATE MEDICAL CARE IF:  °· You have difficulty breathing. °· You have chest pain. °· You have any allergic problems. °Document Released: 07/12/2000 Document Revised: 04/10/2013 Document Reviewed: 10/19/2012 °[employer]® Patient Information ©2015 [employer]. This information is not intended to replace advice given to you by your health care provider. Make sure you discuss any questions you have with your health care provider. ° °

## 2014-03-12 NOTE — Anesthesia Postprocedure Evaluation (Signed)
  Anesthesia Post-op Note  Patient: Angela Santos  Procedure(s) Performed: Procedure(s) (LRB): CYSTOSCOPY/RETROGRADE/URETEROSCOPY/STONE EXTRACTION WITH BASKET (Right)  Patient Location: PACU  Anesthesia Type: General  Level of Consciousness: awake and alert   Airway and Oxygen Therapy: Patient Spontanous Breathing  Post-op Pain: mild  Post-op Assessment: Post-op Vital signs reviewed, Patient's Cardiovascular Status Stable, Respiratory Function Stable, Patent Airway and No signs of Nausea or vomiting  Last Vitals:  Filed Vitals:   03/12/14 1615  BP: 115/64  Pulse: 77  Temp: 36.7 C  Resp: 19    Post-op Vital Signs: stable   Complications: No apparent anesthesia complications

## 2014-03-12 NOTE — Interval H&P Note (Signed)
History and Physical Interval Note: She had a right UPJ stricture and I couldn't complete the initial ureteroscopy.   03/12/2014 12:56 PM  Angela FittingSandra W Anctil  has presented today for surgery, with the diagnosis of RIGHT RENAL STONES  The various methods of treatment have been discussed with the patient and family. After consideration of risks, benefits and other options for treatment, the patient has consented to  Procedure(s): CYSTOSCOPY WITH RIGHT  URETEROSCOPY AND HOLMIUM LASER,STONE EXTRACTION  STENT PLACEMENT (Right) HOLMIUM LASER APPLICATION (Right) as a surgical intervention .  The patient's history has been reviewed, patient examined, no change in status, stable for surgery.  I have reviewed the patient's chart and labs.  Questions were answered to the patient's satisfaction.     Cable Fearn J

## 2014-03-12 NOTE — Op Note (Signed)
NAMBenard Rink:  Carnell, Raymonde                  ACCOUNT NO.:  [id number]  MEDICAL RECORD NO.:  [id number]  LOCATION:  [facility]                         [facility]  PHYSICIAN:  Excell SeltzerJohn J. Annabell HowellsWrenn, M.D.    DATE OF BIRTH:  1965/05/20  DATE OF PROCEDURE:  03/12/2014 DATE OF DISCHARGE:  03/12/2014                              OPERATIVE REPORT   PROCEDURE:  Cystoscopy with removal of right double-J stent, right ureteroscopic stone extraction, and replacement of right double-J stent.  PREOPERATIVE DIAGNOSIS:  Right renal stones and UPJ obstruction.  POSTOPERATIVE DIAGNOSIS:  Right renal stones and UPJ obstruction.  SURGEON:  Excell SeltzerJohn J. Annabell HowellsWrenn, M.D.  ANESTHESIA:  General.  SPECIMEN:  Stone fragments.  DRAINS:  A 6-French 24 cm right double-J stent with string.  COMPLICATIONS:  None.  INDICATIONS:  Angela DavenportSandra is a 48 year old white female with a history of stones, who recently underwent ureteroscopy in an attempt to remove stones, but during that procedure, she was noted to have a stricture just below the right UPJ with some proximal obstruction that is probably from her prior instrumentation.  A stent was left and she returns today for removal of the residual stones.  FINDINGS AND PROCEDURE:  She was given Cipro.  She was taken to the operating room where general anesthetic was induced.  She was placed in lithotomy position.  Her perineum and genitalia were prepped with Betadine solution, and she was draped in usual sterile fashion.  A 22-French scope was passed with 12-degree lens.  The stent was visualized and grasped with grasping forceps and pulled the urethral meatus where a guidewire was passed to the kidney under fluoroscopic guidance.  The stent was removed.  A 38-cm 12/14 digital access sheath inner core was passed easily to the kidney.  This was followed by the assembled sheath.  The inner core and the wire were then removed.  The digital flexible ureteroscope was then inserted through the  sheath to the kidney and a couple of retrievable stone fragments were identified and removed with an NGage basket.  Very tiny dust was not removable because they were too small.  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  It was eventually injected with contrast in a retrograde fashion through the scope to better identify the calyceal anatomy, and these calcifications were indeed outside of the limits of the contrast.  At this point, it was felt that she had been sufficiently cleared of retrievable stones and the ureteroscope was removed.  A guidewire was then reinserted to the kidney.  The sheath was removed under visual inspection of the ureter with no evidence of trauma.  The tight area in the UPJ was now sufficiently patent to allow the 9-French scope, was passed without difficulty.  Once the scope and sheath were out, the cystoscope was reinserted over the wire and a fresh 6-French 24 cm double-J stent with string was inserted without difficulty to the kidney.  The wire was removed leaving good coil in the kidney and a good coil in the bladder.  The bladder was  drained.  The stent string was trimmed and tied and tucked vaginally.  A B and O suppository was placed.  She was taken down from lithotomy position.  Her anesthetic was reversed.  She was moved to recovery in stable condition.  There were no complications.     Excell SeltzerJohn J. Annabell HowellsWrenn, M.D.     JJW/MEDQ  D:  03/12/2014  T:  03/12/2014  Job:  [id number]

## 2014-03-15 ENCOUNTER — Encounter (HOSPITAL_COMMUNITY): Payer: Self-pay | Admitting: Urology

## 2014-03-21 ENCOUNTER — Ambulatory Visit (HOSPITAL_BASED_OUTPATIENT_CLINIC_OR_DEPARTMENT_OTHER): Admission: RE | Admit: 2014-03-21 | Payer: PRIVATE HEALTH INSURANCE | Source: Ambulatory Visit | Admitting: Urology

## 2014-03-21 ENCOUNTER — Encounter (HOSPITAL_BASED_OUTPATIENT_CLINIC_OR_DEPARTMENT_OTHER): Admission: RE | Payer: Self-pay | Source: Ambulatory Visit

## 2014-03-21 SURGERY — CYSTOURETEROSCOPY, WITH STENT INSERTION
Anesthesia: Choice | Laterality: Right

## 2014-05-16 ENCOUNTER — Telehealth: Payer: Self-pay | Admitting: Interventional Cardiology

## 2014-05-16 NOTE — Telephone Encounter (Signed)
pt aware that her holter monitor in Sept 2015 was normal. pt verbalized understanding.

## 2014-05-16 NOTE — Telephone Encounter (Signed)
New message        1. Is this related to a heart monitor you are wearing?  (If the patient says no, please ask     if they are caling about ICD/pacemaker.)  Not wearing monitor now  2. What is your issue??  Pt wore monitor in oct and never received the results (If the patient is calling for results of the heart monitor this     message should be sent to nurse.)

## 2014-05-16 NOTE — Telephone Encounter (Signed)
attempted to return pt call.[email or web address]

## 2017-05-20 ENCOUNTER — Other Ambulatory Visit: Payer: Self-pay | Admitting: Urology

## 2017-05-23 ENCOUNTER — Other Ambulatory Visit: Payer: Self-pay | Admitting: Urology

## 2017-05-23 DIAGNOSIS — N200 Calculus of kidney: Secondary | ICD-10-CM

## 2017-06-08 ENCOUNTER — Encounter (HOSPITAL_COMMUNITY): Payer: Self-pay | Admitting: *Deleted

## 2017-06-08 NOTE — Patient Instructions (Addendum)
Angela Santos  06/08/2017   Your procedure is scheduled on: 06-14-17   [facility]  Entrance Report to Interventional Radiology at 7:30 00 AM   Call this number if you have problems the morning of surgery [phone]   Remember: Do not eat food or drink liquids :After Midnight.     Take these medicines the morning of surgery with A SIP OF WATER: None                                You may not have any metal on your body including hair pins and              piercings  Do not wear jewelry, make-up, lotions, powders or perfumes, deodorant             Do not wear nail polish.  Do not shave  48 hours prior to surgery.               Do not bring valuables to the hospital. [facility] IS NOT             RESPONSIBLE   FOR VALUABLES.  Contacts, dentures or bridgework may not be worn into surgery.  Leave suitcase in the car. After surgery it may be brought to your room.                 Please read over the following fact sheets you were given: _____________________________________________________________________           [facility] - Preparing [facility] Before surgery, you can play an important role.  Because skin is not sterile, your skin needs to be as free of germs as possible.  You can reduce the number of germs on your skin by washing with CHG (chlorahexidine gluconate) soap before surgery.  CHG is an antiseptic cleaner which kills germs and bonds with the skin to continue killing germs even after washing. Please DO NOT use if you have an allergy to CHG or antibacterial soaps.  If your skin becomes reddened/irritated stop using the CHG and inform your nurse when you arrive at Short Stay. Do not shave (including legs and underarms) for at least 48 hours prior to the first CHG shower.  You may shave your face/neck. Please follow these instructions carefully:  1.  Shower with CHG Soap the night before surgery and the  morning of Surgery.  2.  If  you choose to wash your hair, wash your hair first as usual with your  normal  shampoo.  3.  After you shampoo, rinse your hair and body thoroughly to remove the  shampoo.                           4.  Use CHG as you would any other liquid soap.  You can apply chg directly  to the skin and wash                       Gently with a scrungie or clean washcloth.  5.  Apply the CHG Soap to your body ONLY FROM THE NECK DOWN.   Do not use on face/ open  Wound or open sores. Avoid contact with eyes, ears mouth and genitals (private parts).                       Wash face,  Genitals (private parts) with your normal soap.             6.  Wash thoroughly, paying special attention to the area where your surgery  will be performed.  7.  Thoroughly rinse your body with warm water from the neck down.  8.  DO NOT shower/wash with your normal soap after using and rinsing off  the CHG Soap.                9.  Pat yourself dry with a clean towel.            10.  Wear clean pajamas.            11.  Place clean sheets on your bed the night of your first shower and do not  sleep with pets. Day of Surgery : Do not apply any lotions/deodorants the morning of surgery.  Please wear clean clothes to the hospital/surgery center.  FAILURE TO FOLLOW THESE INSTRUCTIONS MAY RESULT IN THE CANCELLATION OF YOUR SURGERY PATIENT SIGNATURE_________________________________  NURSE SIGNATURE__________________________________  ________________________________________________________________________  WHAT IS A BLOOD TRANSFUSION? Blood Transfusion Information  A transfusion is the replacement of blood or some of its parts. Blood is made up of multiple cells which provide different functions.  Red blood cells carry oxygen and are used for blood loss replacement.  White blood cells fight against infection.  Platelets control bleeding.  Plasma helps clot blood.  Other blood products are available for  specialized needs, such as hemophilia or other clotting disorders. BEFORE THE TRANSFUSION  Who gives blood for transfusions?   Healthy volunteers who are fully evaluated to make sure their blood is safe. This is blood bank blood. Transfusion therapy is the safest it has ever been in the practice of medicine. Before blood is taken from a donor, a complete history is taken to make sure that person has no history of diseases nor engages in risky social behavior (examples are intravenous drug use or sexual activity with multiple partners). The donor's travel history is screened to minimize risk of transmitting infections, such as malaria. The donated blood is tested for signs of infectious diseases, such as HIV and hepatitis. The blood is then tested to be sure it is compatible with you in order to minimize the chance of a transfusion reaction. If you or a relative donates blood, this is often done in anticipation of surgery and is not appropriate for emergency situations. It takes many days to process the donated blood. RISKS AND COMPLICATIONS Although transfusion therapy is very safe and saves many lives, the main dangers of transfusion include:   Getting an infectious disease.  Developing a transfusion reaction. This is an allergic reaction to something in the blood you were given. Every precaution is taken to prevent this. The decision to have a blood transfusion has been considered carefully by your caregiver before blood is given. Blood is not given unless the benefits outweigh the risks. AFTER THE TRANSFUSION  Right after receiving a blood transfusion, you will usually feel much better and more energetic. This is especially true if your red blood cells have gotten low (anemic). The transfusion raises the level of the red blood cells which carry oxygen, and this usually causes an energy increase.  The  nurse administering the transfusion will monitor you carefully for complications. HOME CARE  INSTRUCTIONS  No special instructions are needed after a transfusion. You may find your energy is better. Speak with your caregiver about any limitations on activity for underlying diseases you may have. SEEK MEDICAL CARE IF:   Your condition is not improving after your transfusion.  You develop redness or irritation at the intravenous (IV) site. SEEK IMMEDIATE MEDICAL CARE IF:  Any of the following symptoms occur over the next 12 hours:  Shaking chills.  You have a temperature by mouth above 102 F (38.9 C), not controlled by medicine.  Chest, back, or muscle pain.  People around you feel you are not acting correctly or are confused.  Shortness of breath or difficulty breathing.  Dizziness and fainting.  You get a rash or develop hives.  You have a decrease in urine output.  Your urine turns a dark color or changes to pink, red, or brown. Any of the following symptoms occur over the next 10 days:  You have a temperature by mouth above 102 F (38.9 C), not controlled by medicine.  Shortness of breath.  Weakness after normal activity.  The white part of the eye turns yellow (jaundice).  You have a decrease in the amount of urine or are urinating less often.  Your urine turns a dark color or changes to pink, red, or brown. Document Released: 04/02/2000 Document Revised: 06/28/2011 Document Reviewed: 11/20/2007 [facility] Patient Information 2014 [place], [place].  _______________________________________________________________________

## 2017-06-10 ENCOUNTER — Encounter (HOSPITAL_COMMUNITY): Payer: Self-pay

## 2017-06-10 ENCOUNTER — Encounter (INDEPENDENT_AMBULATORY_CARE_PROVIDER_SITE_OTHER): Payer: Self-pay

## 2017-06-10 ENCOUNTER — Other Ambulatory Visit: Payer: Self-pay

## 2017-06-10 ENCOUNTER — Encounter (HOSPITAL_COMMUNITY)
Admission: RE | Admit: 2017-06-10 | Discharge: 2017-06-10 | Disposition: A | Discharge status: Home / Self Care | Payer: 59 | Source: Ambulatory Visit | Attending: Urology | Admitting: Urology

## 2017-06-10 DIAGNOSIS — Z01818 Encounter for other preprocedural examination: Secondary | ICD-10-CM | POA: Diagnosis not present

## 2017-06-10 DIAGNOSIS — R9431 Abnormal electrocardiogram [ECG] [EKG]: Secondary | ICD-10-CM | POA: Diagnosis not present

## 2017-06-10 DIAGNOSIS — N200 Calculus of kidney: Secondary | ICD-10-CM | POA: Insufficient documentation

## 2017-06-10 DIAGNOSIS — I10 Essential (primary) hypertension: Secondary | ICD-10-CM | POA: Diagnosis not present

## 2017-06-10 DIAGNOSIS — I4510 Unspecified right bundle-branch block: Secondary | ICD-10-CM | POA: Insufficient documentation

## 2017-06-10 LAB — CBC
HEMATOCRIT: 40.3 % (ref 36.0–46.0)
Hemoglobin: 13.7 g/dL (ref 12.0–15.0)
MCH: 31.2 pg (ref 26.0–34.0)
MCHC: 34 g/dL (ref 30.0–36.0)
MCV: 91.8 fL (ref 78.0–100.0)
Platelets: 211 10*3/uL (ref 150–400)
RBC: 4.39 MIL/uL (ref 3.87–5.11)
RDW: 12.6 % (ref 11.5–15.5)
WBC: 5.3 10*3/uL (ref 4.0–10.5)

## 2017-06-10 LAB — BASIC METABOLIC PANEL
Anion gap: 12 (ref 5–15)
BUN: 32 mg/dL — AB (ref 6–20)
CALCIUM: 9.3 mg/dL (ref 8.9–10.3)
CO2: 23 mmol/L (ref 22–32)
Chloride: 107 mmol/L (ref 101–111)
Creatinine, Ser: 0.79 mg/dL (ref 0.44–1.00)
GFR calc Af Amer: >60 mL/min (ref >60)
GLUCOSE: 89 mg/dL (ref 65–99)
POTASSIUM: 4.3 mmol/L (ref 3.5–5.1)
Sodium: 142 mmol/L (ref 135–145)

## 2017-06-10 LAB — TYPE AND SCREEN
ABO/RH(D): A POS
ANTIBODY SCREEN: POSITIVE

## 2017-06-10 LAB — PREGNANCY, URINE: Preg Test, Ur: NEGATIVE

## 2017-06-10 NOTE — Progress Notes (Signed)
06-10-17 BMP results routed to Dr. Berneice HeinrichManny for review.

## 2017-06-10 NOTE — Progress Notes (Signed)
06-10-17 Per Lab, T&S was positive for antibodies. T&S needs to be repeated the day of surgery.

## 2017-06-12 ENCOUNTER — Other Ambulatory Visit: Payer: Self-pay | Admitting: Radiology

## 2017-06-13 NOTE — H&P (Signed)
CC: I have kidney stones.  HPI: Angela Santos is a 52 year-old female established patient who is here for renal calculi.  The problem is on the right side. She first stated noticing pain on approximately 03/19/2017. This is not her first kidney stone. She is currently having flank pain. She denies having back pain, groin pain, nausea, vomiting, fever, and chills. She has not caught a stone in her urine strainer since her symptoms began.   She has had ureteroscopy for treatment of her stones in the past.   Angela Santos returns today in f/u. She has a history of stones with a right UPJ stricture and was last treated in November 2015 with ureteroscopic stone extraction and stenting. She is have some intermittent right flank pain over the last few weeks and it was worse over the weekend. She had nausea. the pain was severe but responded to pain med. She had no hematuria. Marland Kitchen A KUB at her last visit in 2/16 showed stable right mid and upper pole stones with a RLP stone that may have been in the midpole before but it is difficult to say. There are no left renal or ureteral stones. The right renal US today shows mild dilation of the renal pelvis and proximal ureter with stones noted.       ALLERGIES: Penicillins    MEDICATIONS: None   GU PSH: Cysto Uretero Lithotripsy - 2015 Cysto/uretero W/up Stricture - 2015 Cystoscopy Insert Stent - 2015, 2015, 2012 ESWL - 2015, 2012, 2010, 2009 Ureteroscopic stone removal - 2015      PSH Notes: Cystoscopy With Ureteroscopy With Removal Of Calculus, Cystoscopy With Insertion Of Ureteral Stent Right, Cystourethroscopy W/ Ureteroscopy W/ Tx Ureteropelv Junct St, Cystoscopy With Ureteroscopy With Lithotripsy, Cystoscopy With Insertion Of Ureteral Stent Right, Lithotripsy, Lithotripsy, Cystoscopy With Insertion Of Ureteral Stent Left, Lithotripsy, Lithotripsy   NON-GU PSH: None   GU PMH: Renal calculus, Nephrolithiasis - 2016 Ureteral stricture, Ureteral stricture -  2016 Other microscopic hematuria, Microscopic hematuria - 2015 Personal Hx Urinary Tract Infections, History of urinary tract infection - 2015 Ureteral calculus, Calculus of left ureter - 2015, Calculus of right ureter, - 2015 History of urolithiasis, Nephrolithiasis - 2014 Personal Hx Oth Urinary System diseases, History of hematuria - 2014      PMH Notes:  2010-11-10 11:46:45 - Note: Nephrolithiasis Of The Left Kidney   NON-GU PMH: Encounter for general adult medical examination without abnormal findings, Encounter for preventive health examination - 2016    FAMILY HISTORY: Acute Myocardial Infarction - Father Death In The Family Father - Mother Family Health Status - Mother's Age - Runs In Family Family Health Status Number - Runs In Family Heart Disease - Mother Hematuria - Mother nephrolithiasis - Father, Mother   SOCIAL HISTORY: Marital Status: Married Preferred Language: English; Race: White Current Smoking Status: Patient smokes.   Tobacco Use Assessment Completed: Used Tobacco in last 30 days? Drinks 2 caffeinated drinks per day.     Notes: Current every day smoker, Caffeine Use, Alcohol Use, Marital History - Currently Married, Tobacco Use, Occupation:   REVIEW OF SYSTEMS:    GU Review Female:   Patient denies frequent urination, stream starts and stops, burning /pain with urination, being pregnant, hard to postpone urination, get up at night to urinate, leakage of urine, have to strain to urinate, and trouble starting your stream.  Gastrointestinal (Upper):   Patient denies nausea, vomiting, and indigestion/ heartburn.  Gastrointestinal (Lower):   Patient denies diarrhea and constipation.  Constitutional:  Patient denies fever, night sweats, weight loss, and fatigue.  Skin:   Patient denies skin rash/ lesion and itching.  Eyes:   Patient denies blurred vision and double vision.  Ears/ Nose/ Throat:   Patient denies sore throat and sinus problems.   Hematologic/Lymphatic:   Patient denies swollen glands and easy bruising.  Cardiovascular:   Patient denies leg swelling and chest pains.  Respiratory:   Patient denies cough and shortness of breath.  Endocrine:   Patient denies excessive thirst.  Musculoskeletal:   Patient denies back pain and joint pain.  Neurological:   Patient denies headaches and dizziness.  Psychologic:   Patient denies depression and anxiety.   VITAL SIGNS:      05/11/2017 04:07 PM  Weight 110 lb / 49.9 kg  Height 62 in / 157.48 cm  BP 114/67 mmHg  Pulse 58 /min  Temperature 98.2 F / 36.7 C  BMI 20.1 kg/m   MULTI-SYSTEM PHYSICAL EXAMINATION:    Constitutional: Well-nourished. No physical deformities. Normally developed. Good grooming.  Respiratory: No labored breathing, no use of accessory muscles. CTA  Cardiovascular: Normal temperature, RRR without murmur     PAST DATA REVIEWED:  Source Of History:  Patient  Urine Test Review:   Urinalysis  X-Ray Review: KUB: Reviewed Films. Discussed With Patient.     PROCEDURES:         KUB - [id number]  A single view of the abdomen is obtained. There is a 29 x 20mm right mid renal stone that has a dense uniform appearance most consistent with pure calcium oxalate monohydrate. There is a 6mm collection of stones in the upper pole. No ureteral or left renal stones are noted. No bone, gas or soft tissue abnormalities are seen.                Urinalysis w/Scope - 81001 Dipstick Dipstick Cont'd Micro  Color: Yellow Bilirubin: Neg WBC/hpf: 0 - 5/hpf  Appearance: Cloudy Ketones: Neg RBC/hpf: 10 - 20/hpf  Specific Gravity: 1.020 Blood: 2+ Bacteria: Rare (0-9/hpf)  pH: 7.0 Protein: 2+ Cystals: Amorph Urates  Glucose: Neg Urobilinogen: 0.2 Casts: NS (Not Seen)    Nitrites: Neg Trichomonas: Not Present    Leukocyte Esterase: Neg Mucous: Not Present      Epithelial Cells: 0 - 5/hpf      Yeast: NS (Not Seen)      Sperm: Not Present    Notes:      ASSESSMENT:       ICD-10 Details  1 GU:   Renal calculus - N20.0 Right, Worsening - She has a 2 x 3cm right mid renal stone that is probably pure CaOx monohydrate. I am going to get a CT urogram to better clarify her anatomy and will get her set up for PCNL. I reviewed the risks of bleeding, infection, injury to the kidney and adjacent structures, renal loss, need for secondary procedures, urine leaks, thrombotic events and anesthetic complications.   2   Flank Pain - R10.84 She is having intermittent pain so I have given pain and nausea meds.    PLAN:            Medications New Meds: Hydrocodone-Acetaminophen 7.5 mg-325 mg tablet 1 tablet PO Q 6 H   #20  0 Refill(s)  Zofran 4 mg tablet 1 tablet PO Q 6 H PRN   #20  1 Refill(s)            Orders X-Rays: KUB  Schedule X-Rays: C.T. Stone Protocol Without Contrast - ASAP  Return Visit/Planned Activity: Next Available Appointment - Schedule Surgery             Note: Needs right PCNL.

## 2017-06-14 ENCOUNTER — Encounter (HOSPITAL_COMMUNITY): Payer: Self-pay

## 2017-06-14 ENCOUNTER — Ambulatory Visit (HOSPITAL_COMMUNITY)
Admission: RE | Admit: 2017-06-14 | Discharge: 2017-06-14 | Disposition: A | Discharge status: Home / Self Care | Payer: 59 | Source: Ambulatory Visit | Attending: Urology | Admitting: Urology

## 2017-06-14 ENCOUNTER — Other Ambulatory Visit: Payer: Self-pay

## 2017-06-14 ENCOUNTER — Ambulatory Visit (HOSPITAL_COMMUNITY): Payer: 59 | Admitting: Certified Registered Nurse Anesthetist

## 2017-06-14 ENCOUNTER — Observation Stay (HOSPITAL_COMMUNITY): Payer: 59

## 2017-06-14 ENCOUNTER — Observation Stay (HOSPITAL_COMMUNITY)
Admission: RE | Admit: 2017-06-14 | Discharge: 2017-06-16 | Disposition: A | Discharge status: Home / Self Care | Payer: 59 | Source: Ambulatory Visit | Attending: Urology | Admitting: Urology

## 2017-06-14 ENCOUNTER — Encounter (HOSPITAL_COMMUNITY)
Admission: RE | Disposition: A | Discharge status: Home / Self Care | Payer: Self-pay | Source: Ambulatory Visit | Attending: Urology

## 2017-06-14 DIAGNOSIS — Z880 Allergy status to penicillin: Secondary | ICD-10-CM | POA: Diagnosis not present

## 2017-06-14 DIAGNOSIS — Z8249 Family history of ischemic heart disease and other diseases of the circulatory system: Secondary | ICD-10-CM | POA: Diagnosis not present

## 2017-06-14 DIAGNOSIS — N200 Calculus of kidney: Secondary | ICD-10-CM

## 2017-06-14 DIAGNOSIS — F329 Major depressive disorder, single episode, unspecified: Secondary | ICD-10-CM | POA: Diagnosis not present

## 2017-06-14 DIAGNOSIS — F17210 Nicotine dependence, cigarettes, uncomplicated: Secondary | ICD-10-CM | POA: Insufficient documentation

## 2017-06-14 DIAGNOSIS — I4519 Other right bundle-branch block: Secondary | ICD-10-CM | POA: Insufficient documentation

## 2017-06-14 DIAGNOSIS — N202 Calculus of kidney with calculus of ureter: Secondary | ICD-10-CM | POA: Diagnosis present

## 2017-06-14 HISTORY — PX: NEPHROLITHOTOMY: SHX5134

## 2017-06-14 HISTORY — PX: IR URETERAL STENT RIGHT NEW ACCESS W/O SEP NEPHROSTOMY CATH: IMG6076

## 2017-06-14 LAB — BASIC METABOLIC PANEL
Anion gap: 11 (ref 5–15)
BUN: 31 mg/dL — ABNORMAL HIGH (ref 6–20)
CO2: 22 mmol/L (ref 22–32)
Calcium: 9.2 mg/dL (ref 8.9–10.3)
Chloride: 109 mmol/L (ref 101–111)
Creatinine, Ser: 0.86 mg/dL (ref 0.44–1.00)
Glucose, Bld: 98 mg/dL (ref 65–99)
POTASSIUM: 4.1 mmol/L (ref 3.5–5.1)
SODIUM: 142 mmol/L (ref 135–145)

## 2017-06-14 LAB — CBC WITH DIFFERENTIAL/PLATELET
BASOS ABS: 0 10*3/uL (ref 0.0–0.1)
Basophils Relative: 1 %
EOS PCT: 4 %
Eosinophils Absolute: 0.2 10*3/uL (ref 0.0–0.7)
HEMATOCRIT: 38.9 % (ref 36.0–46.0)
Hemoglobin: 13.6 g/dL (ref 12.0–15.0)
LYMPHS PCT: 37 %
Lymphs Abs: 1.2 10*3/uL (ref 0.7–4.0)
MCH: 31.4 pg (ref 26.0–34.0)
MCHC: 35 g/dL (ref 30.0–36.0)
MCV: 89.8 fL (ref 78.0–100.0)
Monocytes Absolute: 0.6 10*3/uL (ref 0.1–1.0)
Monocytes Relative: 17 %
Neutro Abs: 1.4 10*3/uL — ABNORMAL LOW (ref 1.7–7.7)
Neutrophils Relative %: 41 %
PLATELETS: 193 10*3/uL (ref 150–400)
RBC: 4.33 MIL/uL (ref 3.87–5.11)
RDW: 12.4 % (ref 11.5–15.5)
WBC: 3.4 10*3/uL — AB (ref 4.0–10.5)

## 2017-06-14 LAB — PROTIME-INR
INR: 0.94
Prothrombin Time: 12.5 seconds (ref 11.4–15.2)

## 2017-06-14 LAB — HEMOGLOBIN AND HEMATOCRIT, BLOOD
HCT: 36.8 % (ref 36.0–46.0)
Hemoglobin: 12.5 g/dL (ref 12.0–15.0)

## 2017-06-14 SURGERY — NEPHROLITHOTOMY PERCUTANEOUS
Anesthesia: General | Laterality: Right

## 2017-06-14 MED ORDER — PROMETHAZINE HCL 25 MG/ML IJ SOLN: 6.2500 mg | INTRAMUSCULAR | Status: DC | PRN

## 2017-06-14 MED ORDER — HYDROMORPHONE HCL 1 MG/ML IJ SOLN
0.5000 mg | INTRAMUSCULAR | Status: DC | PRN
  Administered 2017-06-15 – 2017-06-16 (×2): 1 mg via INTRAVENOUS
  Filled 2017-06-14 (×2): qty 1

## 2017-06-14 MED ORDER — FENTANYL CITRATE (PF) 100 MCG/2ML IJ SOLN
INTRAMUSCULAR | Status: AC | PRN
  Administered 2017-06-14 (×3): 50 ug via INTRAVENOUS

## 2017-06-14 MED ORDER — MEPERIDINE HCL 50 MG/ML IJ SOLN
INTRAMUSCULAR | Status: AC
  Administered 2017-06-14: 12.5 mg via INTRAVENOUS
  Filled 2017-06-14: qty 1

## 2017-06-14 MED ORDER — LIDOCAINE 2% (20 MG/ML) 5 ML SYRINGE
INTRAMUSCULAR | Status: DC | PRN
  Administered 2017-06-14: 60 mg via INTRAVENOUS

## 2017-06-14 MED ORDER — SODIUM CHLORIDE 0.9 % IR SOLN
Status: DC | PRN
  Administered 2017-06-14: 16940 mL

## 2017-06-14 MED ORDER — ROCURONIUM BROMIDE 50 MG/5ML IV SOSY
PREFILLED_SYRINGE | INTRAVENOUS | Status: DC | PRN
  Administered 2017-06-14: 50 mg via INTRAVENOUS

## 2017-06-14 MED ORDER — DEXAMETHASONE SODIUM PHOSPHATE 10 MG/ML IJ SOLN
INTRAMUSCULAR | Status: DC | PRN
  Administered 2017-06-14: 10 mg via INTRAVENOUS

## 2017-06-14 MED ORDER — HYDROMORPHONE HCL 1 MG/ML IJ SOLN
INTRAMUSCULAR | Status: AC
  Administered 2017-06-14: 0.5 mg via INTRAVENOUS
  Filled 2017-06-14: qty 1

## 2017-06-14 MED ORDER — ROCURONIUM BROMIDE 10 MG/ML (PF) SYRINGE
PREFILLED_SYRINGE | INTRAVENOUS | Status: AC
  Filled 2017-06-14: qty 5

## 2017-06-14 MED ORDER — CIPROFLOXACIN IN D5W 400 MG/200ML IV SOLN: 400.0000 mg | INTRAVENOUS | Status: DC

## 2017-06-14 MED ORDER — DIPHENHYDRAMINE HCL 25 MG PO CAPS: 25.0000 mg | ORAL_CAPSULE | Freq: Every evening | ORAL | Status: DC | PRN

## 2017-06-14 MED ORDER — SENNOSIDES-DOCUSATE SODIUM 8.6-50 MG PO TABS: 1.0000 | ORAL_TABLET | Freq: Every evening | ORAL | Status: DC | PRN

## 2017-06-14 MED ORDER — ONDANSETRON HCL 4 MG/2ML IJ SOLN
INTRAMUSCULAR | Status: DC | PRN
  Administered 2017-06-14: 4 mg via INTRAVENOUS

## 2017-06-14 MED ORDER — ONDANSETRON HCL 4 MG/2ML IJ SOLN
4.0000 mg | INTRAMUSCULAR | Status: DC | PRN
  Administered 2017-06-15 – 2017-06-16 (×3): 4 mg via INTRAVENOUS
  Filled 2017-06-14 (×3): qty 2

## 2017-06-14 MED ORDER — PROPOFOL 10 MG/ML IV BOLUS
INTRAVENOUS | Status: AC
  Filled 2017-06-14: qty 20

## 2017-06-14 MED ORDER — FENTANYL CITRATE (PF) 250 MCG/5ML IJ SOLN
INTRAMUSCULAR | Status: AC
  Filled 2017-06-14: qty 5

## 2017-06-14 MED ORDER — HYOSCYAMINE SULFATE 0.125 MG SL SUBL
0.1250 mg | SUBLINGUAL_TABLET | SUBLINGUAL | Status: DC | PRN
Start: 2017-06-14 — End: 2017-06-16
  Filled 2017-06-14: qty 1

## 2017-06-14 MED ORDER — LIDOCAINE-EPINEPHRINE (PF) 1 %-1:200000 IJ SOLN
INTRAMUSCULAR | Status: AC | PRN
  Administered 2017-06-14: 20 mL

## 2017-06-14 MED ORDER — SUGAMMADEX SODIUM 200 MG/2ML IV SOLN
INTRAVENOUS | Status: DC | PRN
  Administered 2017-06-14: 100 mg via INTRAVENOUS

## 2017-06-14 MED ORDER — CIPROFLOXACIN IN D5W 400 MG/200ML IV SOLN
400.0000 mg | Freq: Two times a day (BID) | INTRAVENOUS | Status: DC
  Administered 2017-06-14 – 2017-06-16 (×4): 400 mg via INTRAVENOUS
  Filled 2017-06-14 (×3): qty 200

## 2017-06-14 MED ORDER — DIPHENHYDRAMINE-APAP (SLEEP) 25-500 MG PO TABS: 1.0000 | ORAL_TABLET | Freq: Every evening | ORAL | Status: DC | PRN

## 2017-06-14 MED ORDER — MIDAZOLAM HCL 2 MG/2ML IJ SOLN
INTRAMUSCULAR | Status: AC | PRN
  Administered 2017-06-14 (×3): 1 mg via INTRAVENOUS

## 2017-06-14 MED ORDER — CIPROFLOXACIN IN D5W 400 MG/200ML IV SOLN
INTRAVENOUS | Status: AC
  Filled 2017-06-14: qty 200

## 2017-06-14 MED ORDER — HYDROMORPHONE HCL 1 MG/ML IJ SOLN
0.2500 mg | INTRAMUSCULAR | Status: DC | PRN
  Administered 2017-06-14 (×2): 0.5 mg via INTRAVENOUS

## 2017-06-14 MED ORDER — LIDOCAINE HCL 1 % IJ SOLN
INTRAMUSCULAR | Status: AC
  Filled 2017-06-14: qty 20

## 2017-06-14 MED ORDER — SUGAMMADEX SODIUM 200 MG/2ML IV SOLN
INTRAVENOUS | Status: AC
  Filled 2017-06-14: qty 2

## 2017-06-14 MED ORDER — LIDOCAINE 2% (20 MG/ML) 5 ML SYRINGE
INTRAMUSCULAR | Status: AC
  Filled 2017-06-14: qty 5

## 2017-06-14 MED ORDER — POTASSIUM CHLORIDE IN NACL 20-0.45 MEQ/L-% IV SOLN
INTRAVENOUS | Status: DC
  Administered 2017-06-14 (×2): via INTRAVENOUS
  Filled 2017-06-14 (×4): qty 1000

## 2017-06-14 MED ORDER — LACTATED RINGERS IV SOLN
INTRAVENOUS | Status: DC
  Administered 2017-06-14 (×3): via INTRAVENOUS

## 2017-06-14 MED ORDER — ONDANSETRON HCL 4 MG PO TABS
4.0000 mg | ORAL_TABLET | Freq: Four times a day (QID) | ORAL | Status: DC | PRN
  Administered 2017-06-14: 4 mg via ORAL
  Filled 2017-06-14 (×3): qty 1

## 2017-06-14 MED ORDER — ONDANSETRON HCL 4 MG/2ML IJ SOLN
INTRAMUSCULAR | Status: AC
  Filled 2017-06-14: qty 2

## 2017-06-14 MED ORDER — IOPAMIDOL (ISOVUE-300) INJECTION 61%
INTRAVENOUS | Status: AC
  Administered 2017-06-14: 15 mL
  Filled 2017-06-14: qty 50

## 2017-06-14 MED ORDER — CIPROFLOXACIN IN D5W 400 MG/200ML IV SOLN
400.0000 mg | INTRAVENOUS | Status: AC
  Administered 2017-06-14: 400 mg via INTRAVENOUS

## 2017-06-14 MED ORDER — FENTANYL CITRATE (PF) 100 MCG/2ML IJ SOLN
INTRAMUSCULAR | Status: DC | PRN
  Administered 2017-06-14 (×5): 50 ug via INTRAVENOUS

## 2017-06-14 MED ORDER — MIDAZOLAM HCL 2 MG/2ML IJ SOLN
INTRAMUSCULAR | Status: AC
  Filled 2017-06-14: qty 6

## 2017-06-14 MED ORDER — MEPERIDINE HCL 50 MG/ML IJ SOLN
6.2500 mg | INTRAMUSCULAR | Status: DC | PRN
  Administered 2017-06-14: 12.5 mg via INTRAVENOUS

## 2017-06-14 MED ORDER — ACETAMINOPHEN 500 MG PO TABS: 500.0000 mg | ORAL_TABLET | Freq: Every evening | ORAL | Status: DC | PRN

## 2017-06-14 MED ORDER — FLEET ENEMA 7-19 GM/118ML RE ENEM: 1.0000 | ENEMA | Freq: Once | RECTAL | Status: DC | PRN

## 2017-06-14 MED ORDER — IOPAMIDOL (ISOVUE-300) INJECTION 61%
50.0000 mL | Freq: Once | INTRAVENOUS | Status: AC | PRN
  Administered 2017-06-14: 15 mL

## 2017-06-14 MED ORDER — BISACODYL 10 MG RE SUPP: 10.0000 mg | Freq: Every day | RECTAL | Status: DC | PRN

## 2017-06-14 MED ORDER — HYDROCODONE-ACETAMINOPHEN 7.5-325 MG PO TABS
1.0000 | ORAL_TABLET | Freq: Four times a day (QID) | ORAL | Status: DC | PRN
  Administered 2017-06-14 – 2017-06-15 (×3): 1 via ORAL
  Filled 2017-06-14 (×3): qty 1

## 2017-06-14 MED ORDER — ACETAMINOPHEN 325 MG PO TABS: 650.0000 mg | ORAL_TABLET | ORAL | Status: DC | PRN

## 2017-06-14 MED ORDER — FENTANYL CITRATE (PF) 100 MCG/2ML IJ SOLN
INTRAMUSCULAR | Status: AC
  Filled 2017-06-14: qty 4

## 2017-06-14 MED ORDER — DEXAMETHASONE SODIUM PHOSPHATE 10 MG/ML IJ SOLN
INTRAMUSCULAR | Status: AC
  Filled 2017-06-14: qty 1

## 2017-06-14 MED ORDER — IOHEXOL 300 MG/ML  SOLN
INTRAMUSCULAR | Status: DC | PRN
  Administered 2017-06-14: 15 mL

## 2017-06-14 MED ORDER — PROPOFOL 10 MG/ML IV BOLUS
INTRAVENOUS | Status: DC | PRN
  Administered 2017-06-14: 150 mg via INTRAVENOUS

## 2017-06-14 SURGICAL SUPPLY — 47 items
APL SKNCLS STERI-STRIP NONHPOA (GAUZE/BANDAGES/DRESSINGS) ×4
BAG URINE DRAINAGE (UROLOGICAL SUPPLIES) ×3 IMPLANT
BASKET STONE NITINOL 3FRX115MB (UROLOGICAL SUPPLIES) ×2 IMPLANT
BASKET ZERO TIP NITINOL 2.4FR (BASKET) ×3 IMPLANT
BENZOIN TINCTURE PRP APPL 2/3 (GAUZE/BANDAGES/DRESSINGS) ×12 IMPLANT
BLADE SURG 15 STRL LF DISP TIS (BLADE) ×1 IMPLANT
BLADE SURG 15 STRL SS (BLADE) ×3
BSKT STON RTRVL ZERO TP 2.4FR (BASKET) ×1
CATH AINSWORTH 30CC 24FR (CATHETERS) ×3 IMPLANT
CATH ROBINSON RED A/P 20FR (CATHETERS) IMPLANT
CATH URET 5FR 28IN OPEN ENDED (CATHETERS) IMPLANT
CATH URET DUAL LUMEN 6-10FR 50 (CATHETERS) ×3 IMPLANT
CATH UROLOGY TORQUE 40 (MISCELLANEOUS) ×2 IMPLANT
CATH X-FORCE N30 NEPHROSTOMY (TUBING) ×3 IMPLANT
COVER SURGICAL LIGHT HANDLE (MISCELLANEOUS) ×3 IMPLANT
DRAPE C-ARM 42X120 X-RAY (DRAPES) ×3 IMPLANT
DRAPE LINGEMAN PERC (DRAPES) ×3 IMPLANT
DRAPE SURG IRRIG POUCH 19X23 (DRAPES) ×3 IMPLANT
DRSG PAD ABDOMINAL 8X10 ST (GAUZE/BANDAGES/DRESSINGS) ×6 IMPLANT
DRSG TEGADERM 8X12 (GAUZE/BANDAGES/DRESSINGS) ×6 IMPLANT
FIBER LASER FLEXIVA 1000 (UROLOGICAL SUPPLIES) IMPLANT
FIBER LASER FLEXIVA 365 (UROLOGICAL SUPPLIES) IMPLANT
FIBER LASER FLEXIVA 550 (UROLOGICAL SUPPLIES) IMPLANT
FIBER LASER TRAC TIP (UROLOGICAL SUPPLIES) IMPLANT
GAUZE SPONGE 4X4 12PLY STRL (GAUZE/BANDAGES/DRESSINGS) ×3 IMPLANT
GLOVE SURG SS PI 8.0 STRL IVOR (GLOVE) IMPLANT
GOWN STRL REUS W/TWL XL LVL3 (GOWN DISPOSABLE) ×3 IMPLANT
KIT BASIN OR (CUSTOM PROCEDURE TRAY) ×3 IMPLANT
MANIFOLD NEPTUNE II (INSTRUMENTS) ×3 IMPLANT
MASK EYE SHIELD (GAUZE/BANDAGES/DRESSINGS) ×3 IMPLANT
NS IRRIG 1000ML POUR BTL (IV SOLUTION) ×3 IMPLANT
PACK CYSTO (CUSTOM PROCEDURE TRAY) ×3 IMPLANT
PAD ABD 8X10 STRL (GAUZE/BANDAGES/DRESSINGS) ×4 IMPLANT
PROBE LITHOCLAST ULTRA 3.8X403 (UROLOGICAL SUPPLIES) ×2 IMPLANT
PROBE PNEUMATIC 1.0MMX570MM (UROLOGICAL SUPPLIES) ×3 IMPLANT
SET IRRIG Y TYPE TUR BLADDER L (SET/KITS/TRAYS/PACK) ×3 IMPLANT
SPONGE LAP 4X18 X RAY DECT (DISPOSABLE) ×3 IMPLANT
STONE CATCHER W/TUBE ADAPTER (UROLOGICAL SUPPLIES) IMPLANT
SUT SILK 2 0 30  PSL (SUTURE) ×2
SUT SILK 2 0 30 PSL (SUTURE) ×1 IMPLANT
SYR 10ML LL (SYRINGE) ×3 IMPLANT
SYR 20CC LL (SYRINGE) ×6 IMPLANT
TOWEL OR 17X26 10 PK STRL BLUE (TOWEL DISPOSABLE) ×3 IMPLANT
TOWEL OR NON WOVEN STRL DISP B (DISPOSABLE) ×3 IMPLANT
TRAY FOLEY W/METER SILVER 16FR (SET/KITS/TRAYS/PACK) ×3 IMPLANT
TUBING CONNECTING 10 (TUBING) ×6 IMPLANT
TUBING CONNECTING 10' (TUBING) ×3

## 2017-06-14 NOTE — Anesthesia Postprocedure Evaluation (Signed)
Anesthesia Post Note  Patient: Angela Santos  Procedure(s) Performed: RIGHT NEPHROLITHOTOMY PERCUTANEOUS FIRST STAGE (Right )     Patient location during evaluation: PACU Anesthesia Type: General Level of consciousness: awake and alert Pain management: pain level controlled Vital Signs Assessment: post-procedure vital signs reviewed and stable Respiratory status: spontaneous breathing, nonlabored ventilation, respiratory function stable and patient connected to nasal cannula oxygen Cardiovascular status: blood pressure returned to baseline and stable Postop Assessment: no apparent nausea or vomiting Anesthetic complications: no    Last Vitals:  Vitals:   06/14/17 1545 06/14/17 1600  BP: (!) 133/56 (!) 121/53  Pulse: 80 66  Resp: 17 20  Temp:    SpO2: 100% 100%    Last Pain:  Vitals:   06/14/17 1600  PainSc: 5                  Natale Thoma S

## 2017-06-14 NOTE — Anesthesia Procedure Notes (Signed)
Procedure Name: Intubation Date/Time: 06/14/2017 1:33 PM Performed by: Montel Clock, CRNA Pre-anesthesia Checklist: Patient identified, Emergency Drugs available, Suction available, Patient being monitored and Timeout performed Patient Re-evaluated:Patient Re-evaluated prior to induction Oxygen Delivery Method: Circle system utilized Preoxygenation: Pre-oxygenation with 100% oxygen Induction Type: IV induction Ventilation: Mask ventilation without difficulty Laryngoscope Size: Mac and 3 Grade View: Grade II Tube type: Oral Tube size: 7.0 mm Number of attempts: 1 Airway Equipment and Method: Stylet Placement Confirmation: ETT inserted through vocal cords under direct vision,  positive ETCO2 and breath sounds checked- equal and bilateral Secured at: 21 cm Tube secured with: Tape Dental Injury: Teeth and Oropharynx as per pre-operative assessment

## 2017-06-14 NOTE — Op Note (Signed)
Procedure: First stage right percutaneous nephrolithotomy for 3 cm stone.  Preop diagnosis: 3 cm right renal pelvic stone.  Postop diagnosis: Same.  Surgeon: Dr. Irine Seal.  Anesthesia: General.  Specimen: Stone fragments.  Drains: 1.  Foley catheter. 2.  24 French Ainsworth nephrostomy catheter on the right. 3.  6 French safety catheter on the right.  EBL: Approximately 200 mL.  Complication: None.  Indications: Angela Santos is a 52 year old white female with a history of recurrent urolithiasis who recently presented to the office and was found to have a 3 cm right renal pelvic stone with 2-3 small satellite stones.  It was felt that percutaneous nephrolithotomy was the most appropriate treatment option.  Procedure: Angela Santos was initially taken to interventional radiology where she was given Cipro and underwent placement of a right lower pole percutaneous nephrostomy access catheter.  She was then taken to the operating room where general anesthetic was induced on the holding room stretcher.  A Foley catheter was inserted using sterile technique.  She was fitted with PAS hose.  She was then rolled prone onto chest rolls and all pressure points were padded.  The right nephrostomy site was then prepped with Betadine solution and she was draped in the usual sterile fashion.  The suture holding the access catheter to the skin was cut and the Was removed.  A guidewire was passed to the bladder under fluoroscopic guidance.  The nephrostomy site incision was enlarged to 2 cm with a scalpel.  The dual-lumen 8 French catheter was passed over the wire to the proximal ureter.  A second wire was then advanced to the bladder.  Amplatz superstiff wires were used.  The safety wire was then placed in a coil after the dual-lumen catheter was removed.  The NephroMax nephrostomy balloon catheter was then passed over the wire and advanced to the renal pelvis under fluoroscopic guidance.  The balloon was inflated  to 18 atm and the nephrostomy sheath was advanced over the balloon to the renal pelvis.  The balloon was deflated and removed but the wire was left in place.  The rigid nephroscope was then prepared and inserted and the stone was immediately visualized in the renal pelvis.  The stone was then engaged with the ultrasonic/hydraulic lithotrite and progressively broken into manageable fragments.  The fragments were initially removed using graspers.  Eventually the stone was broken into pieces that flushed into an upper pole calyx and required retrieval using the flexible nephroscope and a large nitinol basket.  Once all of the fragments of the primary stone were removed the collecting system was thoroughly inspected both fluoroscopically and endoscopically.  There was a residual calcification approximately 3-5 mm in size that appeared to be in a mid pole calyx but very thorough inspection using the flexible scope was unable to identify the stone.  At this point a 6 [place] safety catheter was inserted over the safety wire to the bladder and the wire was removed.  The safety catheter was capped.  A 24 [place] Ainsworth catheter was inserted over the working wire through the sheath and contrast was instilled.  The antegrade nephrostogram demonstrated no extravasation and good positioning of the Ainsworth catheter.  The sheath was backed out alongside the catheter and the balloon was filled with approximately 2 mL of sterile water.  The safety catheter and Ainsworth catheter were both  secured to the skin with a 2-0 silk suture.  The sheath was cut away from the catheter and the wire was removed.  A final injection of contrast demonstrated good antegrade flow and no extravasation.  There was some bleeding from the incision and pressure with a sponge was applied for approximately 5 minutes with resolution of the bleeding.  The drapes were removed and the catheter was placed to straight drainage.  A dressing of 4  x 4's and an ABD with Hypafix was applied.  The patient was then rolled back supine on a recovery room stretcher and her anesthetic was reversed.  She was taken to the recovery room in stable condition and there were no complications.

## 2017-06-14 NOTE — Transfer of Care (Signed)
Immediate Anesthesia Transfer of Care Note  Patient: Angela Santos  Procedure(s) Performed: RIGHT NEPHROLITHOTOMY PERCUTANEOUS FIRST STAGE (Right )  Patient Location: PACU  Anesthesia Type:General  Level of Consciousness: awake and patient cooperative  Airway & Oxygen Therapy: Patient Spontanous Breathing and Patient connected to face mask oxygen  Post-op Assessment: Report given to RN and Post -op Vital signs reviewed and stable  Post vital signs: Reviewed and stable  Last Vitals:  Vitals:   06/14/17 1223  BP: 121/78  Pulse: 61  Resp: 16  SpO2: 98%    Last Pain: There were no vitals filed for this visit.       Complications: No apparent anesthesia complications

## 2017-06-14 NOTE — Consult Note (Signed)
Chief Complaint: Patient was seen in consultation today for right percutaneous nephrostomy/nephroureteral catheter placement   Referring Physician(s): Wrenn,J  Supervising Physician: Simonne Come  Patient Status: [facility] - Out-pt  TBA  History of Present Illness: Angela Santos is a 52 y.o. female smoker with history of flank pain and 2 x 3 cm right mid renal stone who presents today for right nephroureteral catheter placement prior to nephrolithotomy.   Past Medical History:  Diagnosis Date  . Depression   . History of syncope    RECURRENT 09/ 2015  VASOVAGAL --  NORMAL HOLTER MONITOR PER DR Verdis Prime CARDIOLOGIST  . Incomplete right bundle branch block   . Nephrolithiasis    BILATERAL  RIGHT > LEFT  . PONV (postoperative nausea and vomiting) 2015    Past Surgical History:  Procedure Laterality Date  . CYSTO/  LEFT RETROGRADE PYELOGRAM/  URTERAL STENT PLACEMENT  10-29-2010  . CYSTOSCOPY/RETROGRADE/URETEROSCOPY/STONE EXTRACTION WITH BASKET Bilateral 02/28/2014   Procedure: CYSTOSCOPY WITH BILATERAL  RETROGRADE RIGHT URETEROSCOPY WITH HOLMIUM LASER AND STONE EXTRACTION WITH RIGHT URETERAL STENTING, UPJ BALLOON DILATION;  Surgeon: Anner Crete, MD;  Location: [facility];  Service: Urology;  Laterality: Bilateral;  . CYSTOSCOPY/RETROGRADE/URETEROSCOPY/STONE EXTRACTION WITH BASKET Right 03/12/2014   Procedure: CYSTOSCOPY/RETROGRADE/URETEROSCOPY/STONE EXTRACTION WITH BASKET;  Surgeon: Anner Crete, MD;  Location: [facility];  Service: Urology;  Laterality: Right;  With STENT  . EXTRACORPOREAL SHOCK WAVE LITHOTRIPSY Bilateral multiple--  last one 09-20-2013 right  . HOLMIUM LASER APPLICATION Right 02/28/2014   Procedure: HOLMIUM LASER APPLICATION;  Surgeon: Anner Crete, MD;  Location: [facility];  Service: Urology;  Laterality: Right;  . NASAL FRACTURE SURGERY  1990 (approx)  . TONSILLECTOMY AND ADENOIDECTOMY  age 6     Allergies: Penicillins  Medications: Prior to Admission medications   Medication Sig Start Date End Date Taking? Authorizing Provider  diphenhydramine-acetaminophen (TYLENOL PM) 25-500 MG TABS tablet Take 1 tablet by mouth at bedtime as needed (sleep).   Yes [provider]  HYDROcodone-acetaminophen (NORCO) 7.5-325 MG tablet Take 1 tablet by mouth every 6 (six) hours as needed for moderate pain.   Yes [provider]  ibuprofen (ADVIL,MOTRIN) 200 MG tablet Take 200 mg by mouth 2 (two) times daily as needed (Pain).    Yes [provider]  ondansetron (ZOFRAN) 4 MG tablet Take 4 mg by mouth every 6 (six) hours as needed for nausea or vomiting.    [provider]     Family History  Problem Relation Age of Onset  . Heart attack Mother   . Heart failure Father   . Heart attack Father     Social History   Socioeconomic History  . Marital status: Married    Spouse name: None  . Number of children: None  . Years of education: None  . Highest education level: None  Social Needs  . Financial resource strain: None  . Food insecurity - worry: None  . Food insecurity - inability: None  . Transportation needs - medical: None  . Transportation needs - non-medical: None  Occupational History  . None  Tobacco Use  . Smoking status: Current Every Day Smoker    Packs/day: 0.25    Years: 35.00    Pack years: 8.75    Types: Cigarettes  . Smokeless tobacco: Never Used  . Tobacco comment: 10 cigarettes a day  Substance and Sexual Activity  . Alcohol use: No  . Drug use: No  . Sexual  activity: Yes  Other Topics Concern  . None  Social History Narrative  . None     Review of Systems currently denies fever, headache, chest pain, dyspnea, cough, abdominal pain, nausea, vomiting or bleeding.  Vital Signs: Ht 5\' 2"  (1.575 m)   Wt 109 lb (49.4 kg)   LMP 01/19/2014   BMI 19.94 kg/m   Physical Exam awake, alert.  Chest with few rhonchi on the  right, left clear.  Heart with regular rate and rhythm.  Abdomen soft, positive bowel sounds, nontender.  Lower extremity edema.  Imaging: No results found.  Labs:  CBC: Recent Labs    06/10/17 0833 06/14/17 0753  WBC 5.3 3.4*  HGB 13.7 13.6  HCT 40.3 38.9  PLT 211 193    COAGS: Recent Labs    06/14/17 0753  INR 0.94    BMP: Recent Labs    06/10/17 0833 06/14/17 0753  NA 142 142  K 4.3 4.1  CL 107 109  CO2 23 22  GLUCOSE 89 98  BUN 32* 31*  CALCIUM 9.3 9.2  CREATININE 0.79 0.86  GFRNONAA >60 >60  GFRAA >60 >60    LIVER FUNCTION TESTS: No results for input(s): BILITOT, AST, ALT, ALKPHOS, PROT, ALBUMIN in the last 8760 hours.  TUMOR MARKERS: No results for input(s): AFPTM, CEA, CA199, CHROMGRNA in the last 8760 hours.  Assessment and Plan:  52 y.o. female smoker with history of flank pain and 2 x 3 cm right mid renal stone who presents today for right nephroureteral catheter placement prior to nephrolithotomy.Risks and benefits of procedure were discussed with the patient/family including, but not limited to, infection, bleeding, significant bleeding causing loss or decrease in renal function or damage to adjacent structures.   All of the patient's questions were answered, patient is agreeable to proceed.  Consent signed and in chart.      Thank you for this interesting consult.  I greatly enjoyed meeting Janeece FittingSandra W Bartolomei and look forward to participating in their care.  A copy of this report was sent to the requesting provider on this date.  Electronically Signed: D. Jeananne RamaKevin Allred, PA-C 06/14/2017, 9:01 AM   I spent a total of  25 minutes  in face to face in clinical consultation, greater than 50% of which was counseling/coordinating care for right percutaneous nephrostomy/nephroureteral catheter placement

## 2017-06-14 NOTE — Anesthesia Preprocedure Evaluation (Signed)
Anesthesia Evaluation  Patient identified by MRN, date of birth, ID band Patient awake    Reviewed: Allergy & Precautions, NPO status , Patient's Chart, lab work & pertinent test results  History of Anesthesia Complications (+) PONV  Airway Mallampati: II  TM Distance: >3 FB Neck ROM: Full    Dental no notable dental hx.    Pulmonary Current Smoker,    Pulmonary exam normal breath sounds clear to auscultation       Cardiovascular negative cardio ROS Normal cardiovascular exam Rhythm:Regular Rate:Normal     Neuro/Psych negative neurological ROS  negative psych ROS   GI/Hepatic negative GI ROS, Neg liver ROS,   Endo/Other  negative endocrine ROS  Renal/GU negative Renal ROS  negative genitourinary   Musculoskeletal negative musculoskeletal ROS (+)   Abdominal   Peds negative pediatric ROS (+)  Hematology negative hematology ROS (+)   Anesthesia Other Findings   Reproductive/Obstetrics negative OB ROS                             Anesthesia Physical Anesthesia Plan  ASA: II  Anesthesia Plan: General   Post-op Pain Management:    Induction: Intravenous  PONV Risk Score and Plan: 3 and Ondansetron, Dexamethasone, Treatment may vary due to age or medical condition and Midazolam  Airway Management Planned: Oral ETT  Additional Equipment:   Intra-op Plan:   Post-operative Plan: Extubation in OR  Informed Consent: I have reviewed the patients History and Physical, chart, labs and discussed the procedure including the risks, benefits and alternatives for the proposed anesthesia with the patient or authorized representative who has indicated his/her understanding and acceptance.   Dental advisory given  Plan Discussed with: CRNA and Surgeon  Anesthesia Plan Comments:         Anesthesia Quick Evaluation

## 2017-06-14 NOTE — Interval H&P Note (Signed)
History and Physical Interval Note:  06/14/2017 12:23 PM  Angela Santos  has presented today for surgery, with the diagnosis of RIGHT RENAL STONE  The various methods of treatment have been discussed with the patient and family. After consideration of risks, benefits and other options for treatment, the patient has consented to  Procedure(s): RIGHT NEPHROLITHOTOMY PERCUTANEOUS FIRST STAGE (Right) as a surgical intervention .  The patient's history has been reviewed, patient examined, no change in status, stable for surgery.  I have reviewed the patient's chart and labs.  Questions were answered to the patient's satisfaction.     Bjorn PippinJohn Kahlil Cowans

## 2017-06-14 NOTE — Procedures (Signed)
Pre Procedure Dx: Nephrolithiasis Post Procedural Dx: Same  Successful US and fluoroscopic guided placement of a right sided [place] catheter via a posterior inferior calyx with tip terminating within the urinary bladder to be used during impending PNL.    EBL: None  Complications: None immediate.  Katherina RightJay Giann Obara, MD Pager #: [phone]

## 2017-06-15 ENCOUNTER — Observation Stay (HOSPITAL_COMMUNITY): Payer: 59

## 2017-06-15 DIAGNOSIS — N202 Calculus of kidney with calculus of ureter: Secondary | ICD-10-CM | POA: Diagnosis not present

## 2017-06-15 LAB — HIV ANTIBODY (ROUTINE TESTING W REFLEX): HIV Screen 4th Generation wRfx: NONREACTIVE

## 2017-06-15 LAB — HEMOGLOBIN AND HEMATOCRIT, BLOOD
HEMATOCRIT: 34.1 % — AB (ref 36.0–46.0)
Hemoglobin: 11.9 g/dL — ABNORMAL LOW (ref 12.0–15.0)

## 2017-06-15 NOTE — Progress Notes (Signed)
1 Day Post-Op  Subjective: Angela Santos is doing well post op.   Angela Santos has minimal pain and the urine is clear in the nephrostomy bag.  The foley has been removed.   Angela Santos has a mild post op anemia with a hgb of 11.9. ROS:  Review of Systems  All other systems reviewed and are negative.   Anti-infectives: Anti-infectives (From admission, onward)   Start     Dose/Rate Route Frequency Ordered Stop   06/14/17 2200  ciprofloxacin (CIPRO) IVPB 400 mg     400 mg 200 mL/hr over 60 Minutes Intravenous Every 12 hours 06/14/17 1639     06/14/17 0830  ciprofloxacin (CIPRO) IVPB 400 mg     400 mg 200 mL/hr over 60 Minutes Intravenous To Radiology 06/14/17 0745 06/14/17 1051   06/14/17 0747  ciprofloxacin (CIPRO) IVPB 400 mg  Status:  Discontinued     400 mg 200 mL/hr over 60 Minutes Intravenous 60 min pre-op 06/14/17 0747 06/14/17 0750      Current Facility-Administered Medications  Medication Dose Route Frequency Provider Last Rate Last Dose  . 0.45 % NaCl with KCl 20 mEq / L infusion   Intravenous Continuous Bjorn PippinWrenn, Anaya Bovee, MD 100 mL/hr at 06/14/17 2354    . diphenhydrAMINE (BENADRYL) capsule 25 mg  25 mg Oral QHS PRN Bjorn PippinWrenn, Dianca Owensby, MD       And  . acetaminophen (TYLENOL) tablet 500 mg  500 mg Oral QHS PRN Bjorn PippinWrenn, Kumari Sculley, MD      . acetaminophen (TYLENOL) tablet 650 mg  650 mg Oral Q4H PRN Bjorn PippinWrenn, Lusine Corlett, MD      . bisacodyl (DULCOLAX) suppository 10 mg  10 mg Rectal Daily PRN Bjorn PippinWrenn, Amara Justen, MD      . ciprofloxacin (CIPRO) IVPB 400 mg  400 mg Intravenous Q12H Bjorn PippinWrenn, Nayana Lenig, MD   Stopped at 06/14/17 2145  . HYDROcodone-acetaminophen (NORCO) 7.5-325 MG per tablet 1 tablet  1 tablet Oral Q6H PRN Bjorn PippinWrenn, Kruze Atchley, MD   1 tablet at 06/14/17 2356  . HYDROmorphone (DILAUDID) injection 0.5-1 mg  0.5-1 mg Intravenous Q2H PRN Bjorn PippinWrenn, Storey Stangeland, MD      . hyoscyamine (LEVSIN SL) SL tablet 0.125 mg  0.125 mg Sublingual Q4H PRN Bjorn PippinWrenn, Dariusz Brase, MD      . ondansetron [facility](ZOFRAN) injection 4 mg  4 mg Intravenous Q4H PRN Bjorn PippinWrenn, Bren Borys, MD      .  ondansetron [facility](ZOFRAN) tablet 4 mg  4 mg Oral Q6H PRN Bjorn PippinWrenn, Adelise Buswell, MD   4 mg at 06/14/17 1943  . senna-docusate (Senokot-S) tablet 1 tablet  1 tablet Oral QHS PRN Bjorn PippinWrenn, Diante Barley, MD      . sodium phosphate (FLEET) 7-19 GM/118ML enema 1 enema  1 enema Rectal Once PRN Bjorn PippinWrenn, Krystn Dermody, MD         Objective: Vital signs in last 24 hours: Temp:  [97.4 F (36.3 C)-98.6 F (37 C)] 98.6 F (37 C) (02/27 0743) Pulse Rate:  [56-91] 62 (02/27 0743) Resp:  [14-21] 20 (02/27 0743) BP: (98-133)/(48-78) 102/50 (02/27 0743) SpO2:  [94 %-100 %] 97 % (02/27 0743) Weight:  [49.4 kg (108 lb 16 oz)] 49.4 kg (108 lb 16 oz) (02/26 1828)  Intake/Output from previous day: 02/26 0701 - 02/27 0700 In: 4053.3 [I.V.:3853.3; IV Piggyback:200] Out: 1535 [Urine:1485; Blood:50] Intake/Output this shift: Total I/O In: -  Out: 575 [Urine:575]   Physical Exam  Constitutional: Angela Santos is well-developed, well-nourished, and in no distress.  Vitals reviewed.   Lab Results:  Recent Labs    06/14/17 0753 06/14/17  1553 06/15/17 0438  WBC 3.4*  --   --   HGB 13.6 12.5 11.9*  HCT 38.9 36.8 34.1*  PLT 193  --   --    BMET Recent Labs    06/14/17 0753  NA 142  K 4.1  CL 109  CO2 22  GLUCOSE 98  BUN 31*  CREATININE 0.86  CALCIUM 9.2   PT/INR Recent Labs    06/14/17 0753  LABPROT 12.5  INR 0.94   ABG No results for input(s): PHART, HCO3 in the last 72 hours.  Invalid input(s): PCO2, PO2  Studies/Results: Dg C-arm 1-60 Min-no Report  Result Date: 06/14/2017 Fluoroscopy was utilized by the requesting physician.  No radiographic interpretation.   Ir Ureteral Stent Right New Access W/o Sep Nephrostomy Cath  Result Date: 06/14/2017 INDICATION: Renal stones, access for right-sided percutaneous nephrolithotomy. EXAM: 1. ULTRASOUND AND FLUOROSCOPIC GUIDANCE FOR PUNCTURE OF THE RIGHT SIDED RENAL COLLECTING SYSTEM. 2. FLUOROSCOPIC GUIDED PLACEMENT OF A RIGHT SIDED NEPHROURETERAL CATHETER. COMPARISON:  CT of the  abdomen and pelvis - 05/19/2017 MEDICATIONS: Ciprofloxacin 400 mg IV; The antibiotic was administered in an appropriate time frame prior to skin puncture. ANESTHESIA/SEDATION: Moderate (conscious) sedation was employed during this procedure. A total of Versed 3 mg and Fentanyl 150 mcg was administered intravenously. Moderate Sedation Time: 12 minutes. The patient's level of consciousness and vital signs were monitored continuously by radiology nursing throughout the procedure under my direct supervision. CONTRAST:  A total of 15 cc Isovue-300 was administered into the renal collecting system. FLUOROSCOPY TIME:  1 minutes 48 seconds (11 mGy) COMPLICATIONS: None immediate. PROCEDURE: Informed written consent was obtained from the patient after a discussion of the risks, benefits, and alternatives to treatment. The right flank region was prepped with Betadine in a sterile fashion, and a sterile drape was applied covering the operative field. A sterile gown and sterile gloves were used for the procedure. A timeout was performed prior to the initiation of the procedure. A pre procedural spot fluoroscopic image was obtained of the upper abdomen. Ultrasound scanning demonstrates mild right-sided pelvicaliectasis as well as an echogenic structure within the inferior aspect of the right renal pelvis compatible with known dominant pelvic renal stone. Under direct ultrasound guidance, a mildly dilated posterior inferior calyx was targeted. Ultrasound images were saved for procedural documentation purposes. Access to the collecting system was confirmed with the injection of a small amount of contrast as well as advancement of a Nitrex wire into the collecting system, past the pelvic stone and into the superior aspect of the right ureter. Under intermittent fluoroscopic guidance, the track was dilated with an Accustick set. Contrast injection confirmed appropriate positioning. Next, the Accustick dilator was exchanged for a  Kumpe the catheter which was advanced through the right ureter to the level of the urinary bladder. Contrast injection confirmed appropriate positioning. Postprocedural spot fluoroscopic and radiographs were obtained in various obliquities and the catheter was sutured to the skin. The catheter was capped and a dressing was placed. The patient tolerated the procedure well without immediate post procedural complication. FINDINGS: Pre procedural spot radiographic images demonstrates an approximately 2.5 x 1.7 cm stone overlying the expected location of the inferior aspect of the right renal pelvis. There is made of 2 additional ill-defined opacities overlying the right renal pelvis which are favored to represent additional nonobstructing renal stones with dominant opacity measuring approximately 0.5 cm. Ultrasound scanning demonstrates mild right-sided pelvicaliectasis. A mildly dilated posterior inferior calyx was successfully accessed with direct ultrasound  guidance. Contrast injection confirmed appropriate access to the collecting system, ultimately allowing placement of a Kumpe catheter through this posterior inferior calyx, past the pelvic stone, through the right ureter with tip terminating within the urinary bladder. IMPRESSION: Successful ultrasound and fluoroscopic guided placement of a right sided 5 [place] Kumpe catheter to the level of the urinary bladder to be utilized during impending nephrolithotomy procedure. Electronically Signed   By: Simonne Come M.D.   On: 06/14/2017 11:25   KUB reviewed.  There are residual fragments in the right mid and upper pole.  The largest cluster is 5-72mm.  Assessment and Plan: Right renal stone fragments remain in the mid and upper pole.   I will proceed with a second look procedure on 2/28.         LOS: 0 days    Bjorn Pippin 06/15/2017 [phone] ID: Angela Santos, female   DOB: [birth year], 52 y.o.   MRN: [id number]

## 2017-06-15 NOTE — H&P (View-Only) (Signed)
1 Day Post-Op  Subjective: Angela Santos is doing well post op.   Angela Santos has minimal pain and the urine is clear in the nephrostomy bag.  The foley has been removed.   Angela Santos has a mild post op anemia with a hgb of 11.9. ROS:  Review of Systems  All other systems reviewed and are negative.   Anti-infectives: Anti-infectives (From admission, onward)   Start     Dose/Rate Route Frequency Ordered Stop   06/14/17 2200  ciprofloxacin (CIPRO) IVPB 400 mg     400 mg 200 mL/hr over 60 Minutes Intravenous Every 12 hours 06/14/17 1639     06/14/17 0830  ciprofloxacin (CIPRO) IVPB 400 mg     400 mg 200 mL/hr over 60 Minutes Intravenous To Radiology 06/14/17 0745 06/14/17 1051   06/14/17 0747  ciprofloxacin (CIPRO) IVPB 400 mg  Status:  Discontinued     400 mg 200 mL/hr over 60 Minutes Intravenous 60 min pre-op 06/14/17 0747 06/14/17 0750      Current Facility-Administered Medications  Medication Dose Route Frequency Provider Last Rate Last Dose  . 0.45 % NaCl with KCl 20 mEq / L infusion   Intravenous Continuous Bjorn PippinWrenn, Clemens Lachman, MD 100 mL/hr at 06/14/17 2354    . diphenhydrAMINE (BENADRYL) capsule 25 mg  25 mg Oral QHS PRN Bjorn PippinWrenn, Alinah Sheard, MD       And  . acetaminophen (TYLENOL) tablet 500 mg  500 mg Oral QHS PRN Bjorn PippinWrenn, Omari Mcmanaway, MD      . acetaminophen (TYLENOL) tablet 650 mg  650 mg Oral Q4H PRN Bjorn PippinWrenn, Lanaiya Lantry, MD      . bisacodyl (DULCOLAX) suppository 10 mg  10 mg Rectal Daily PRN Bjorn PippinWrenn, Katherinne Mofield, MD      . ciprofloxacin (CIPRO) IVPB 400 mg  400 mg Intravenous Q12H Bjorn PippinWrenn, Grisela Mesch, MD   Stopped at 06/14/17 2145  . HYDROcodone-acetaminophen (NORCO) 7.5-325 MG per tablet 1 tablet  1 tablet Oral Q6H PRN Bjorn PippinWrenn, Lenah Messenger, MD   1 tablet at 06/14/17 2356  . HYDROmorphone (DILAUDID) injection 0.5-1 mg  0.5-1 mg Intravenous Q2H PRN Bjorn PippinWrenn, Graysen Woodyard, MD      . hyoscyamine (LEVSIN SL) SL tablet 0.125 mg  0.125 mg Sublingual Q4H PRN Bjorn PippinWrenn, Katasha Riga, MD      . ondansetron [facility](ZOFRAN) injection 4 mg  4 mg Intravenous Q4H PRN Bjorn PippinWrenn, Zaela Graley, MD      .  ondansetron [facility](ZOFRAN) tablet 4 mg  4 mg Oral Q6H PRN Bjorn PippinWrenn, Nayan Proch, MD   4 mg at 06/14/17 1943  . senna-docusate (Senokot-S) tablet 1 tablet  1 tablet Oral QHS PRN Bjorn PippinWrenn, Charan Prieto, MD      . sodium phosphate (FLEET) 7-19 GM/118ML enema 1 enema  1 enema Rectal Once PRN Bjorn PippinWrenn, Chelby Salata, MD         Objective: Vital signs in last 24 hours: Temp:  [97.4 F (36.3 C)-98.6 F (37 C)] 98.6 F (37 C) (02/27 0743) Pulse Rate:  [56-91] 62 (02/27 0743) Resp:  [14-21] 20 (02/27 0743) BP: (98-133)/(48-78) 102/50 (02/27 0743) SpO2:  [94 %-100 %] 97 % (02/27 0743) Weight:  [49.4 kg (108 lb 16 oz)] 49.4 kg (108 lb 16 oz) (02/26 1828)  Intake/Output from previous day: 02/26 0701 - 02/27 0700 In: 4053.3 [I.V.:3853.3; IV Piggyback:200] Out: 1535 [Urine:1485; Blood:50] Intake/Output this shift: Total I/O In: -  Out: 575 [Urine:575]   Physical Exam  Constitutional: Angela Santos is well-developed, well-nourished, and in no distress.  Vitals reviewed.   Lab Results:  Recent Labs    06/14/17 0753 06/14/17  1553 06/15/17 0438  WBC 3.4*  --   --   HGB 13.6 12.5 11.9*  HCT 38.9 36.8 34.1*  PLT 193  --   --    BMET Recent Labs    06/14/17 0753  NA 142  K 4.1  CL 109  CO2 22  GLUCOSE 98  BUN 31*  CREATININE 0.86  CALCIUM 9.2   PT/INR Recent Labs    06/14/17 0753  LABPROT 12.5  INR 0.94   ABG No results for input(s): PHART, HCO3 in the last 72 hours.  Invalid input(s): PCO2, PO2  Studies/Results: Dg C-arm 1-60 Min-no Report  Result Date: 06/14/2017 Fluoroscopy was utilized by the requesting physician.  No radiographic interpretation.   Ir Ureteral Stent Right New Access W/o Sep Nephrostomy Cath  Result Date: 06/14/2017 INDICATION: Renal stones, access for right-sided percutaneous nephrolithotomy. EXAM: 1. ULTRASOUND AND FLUOROSCOPIC GUIDANCE FOR PUNCTURE OF THE RIGHT SIDED RENAL COLLECTING SYSTEM. 2. FLUOROSCOPIC GUIDED PLACEMENT OF A RIGHT SIDED NEPHROURETERAL CATHETER. COMPARISON:  CT of the  abdomen and pelvis - 05/19/2017 MEDICATIONS: Ciprofloxacin 400 mg IV; The antibiotic was administered in an appropriate time frame prior to skin puncture. ANESTHESIA/SEDATION: Moderate (conscious) sedation was employed during this procedure. A total of Versed 3 mg and Fentanyl 150 mcg was administered intravenously. Moderate Sedation Time: 12 minutes. The patient's level of consciousness and vital signs were monitored continuously by radiology nursing throughout the procedure under my direct supervision. CONTRAST:  A total of 15 cc Isovue-300 was administered into the renal collecting system. FLUOROSCOPY TIME:  1 minutes 48 seconds (11 mGy) COMPLICATIONS: None immediate. PROCEDURE: Informed written consent was obtained from the patient after a discussion of the risks, benefits, and alternatives to treatment. The right flank region was prepped with Betadine in a sterile fashion, and a sterile drape was applied covering the operative field. A sterile gown and sterile gloves were used for the procedure. A timeout was performed prior to the initiation of the procedure. A pre procedural spot fluoroscopic image was obtained of the upper abdomen. Ultrasound scanning demonstrates mild right-sided pelvicaliectasis as well as an echogenic structure within the inferior aspect of the right renal pelvis compatible with known dominant pelvic renal stone. Under direct ultrasound guidance, a mildly dilated posterior inferior calyx was targeted. Ultrasound images were saved for procedural documentation purposes. Access to the collecting system was confirmed with the injection of a small amount of contrast as well as advancement of a Nitrex wire into the collecting system, past the pelvic stone and into the superior aspect of the right ureter. Under intermittent fluoroscopic guidance, the track was dilated with an Accustick set. Contrast injection confirmed appropriate positioning. Next, the Accustick dilator was exchanged for a  Kumpe the catheter which was advanced through the right ureter to the level of the urinary bladder. Contrast injection confirmed appropriate positioning. Postprocedural spot fluoroscopic and radiographs were obtained in various obliquities and the catheter was sutured to the skin. The catheter was capped and a dressing was placed. The patient tolerated the procedure well without immediate post procedural complication. FINDINGS: Pre procedural spot radiographic images demonstrates an approximately 2.5 x 1.7 cm stone overlying the expected location of the inferior aspect of the right renal pelvis. There is made of 2 additional ill-defined opacities overlying the right renal pelvis which are favored to represent additional nonobstructing renal stones with dominant opacity measuring approximately 0.5 cm. Ultrasound scanning demonstrates mild right-sided pelvicaliectasis. A mildly dilated posterior inferior calyx was successfully accessed with direct ultrasound  guidance. Contrast injection confirmed appropriate access to the collecting system, ultimately allowing placement of a Kumpe catheter through this posterior inferior calyx, past the pelvic stone, through the right ureter with tip terminating within the urinary bladder. IMPRESSION: Successful ultrasound and fluoroscopic guided placement of a right sided 5 [place] Kumpe catheter to the level of the urinary bladder to be utilized during impending nephrolithotomy procedure. Electronically Signed   By: Simonne Come M.D.   On: 06/14/2017 11:25   KUB reviewed.  There are residual fragments in the right mid and upper pole.  The largest cluster is 5-72mm.  Assessment and Plan: Right renal stone fragments remain in the mid and upper pole.   I will proceed with a second look procedure on 2/28.         LOS: 0 days    Bjorn Pippin 06/15/2017 [phone] ID: Angela Santos, female   DOB: [birth year], 52 y.o.   MRN: [id number]

## 2017-06-16 ENCOUNTER — Ambulatory Visit (HOSPITAL_COMMUNITY): Admission: RE | Admit: 2017-06-16 | Payer: 59 | Source: Ambulatory Visit | Admitting: Urology

## 2017-06-16 ENCOUNTER — Observation Stay (HOSPITAL_COMMUNITY): Payer: 59 | Admitting: Certified Registered Nurse Anesthetist

## 2017-06-16 ENCOUNTER — Encounter (HOSPITAL_COMMUNITY): Payer: Self-pay | Admitting: Anesthesiology

## 2017-06-16 ENCOUNTER — Observation Stay (HOSPITAL_COMMUNITY): Payer: 59

## 2017-06-16 ENCOUNTER — Encounter (HOSPITAL_COMMUNITY)
Admission: RE | Disposition: A | Discharge status: Home / Self Care | Payer: Self-pay | Source: Ambulatory Visit | Attending: Urology

## 2017-06-16 DIAGNOSIS — N202 Calculus of kidney with calculus of ureter: Secondary | ICD-10-CM | POA: Diagnosis not present

## 2017-06-16 HISTORY — PX: NEPHROLITHOTOMY: SHX5134

## 2017-06-16 LAB — SURGICAL PCR SCREEN
MRSA, PCR: NEGATIVE
Staphylococcus aureus: NEGATIVE

## 2017-06-16 SURGERY — NEPHROLITHOTOMY PERCUTANEOUS
Anesthesia: General | Laterality: Right

## 2017-06-16 MED ORDER — LACTATED RINGERS IV SOLN
INTRAVENOUS | Status: DC
  Administered 2017-06-16: 1000 mL via INTRAVENOUS

## 2017-06-16 MED ORDER — FENTANYL CITRATE (PF) 100 MCG/2ML IJ SOLN
INTRAMUSCULAR | Status: DC | PRN
  Administered 2017-06-16: 50 ug via INTRAVENOUS

## 2017-06-16 MED ORDER — PHENYLEPHRINE 40 MCG/ML (10ML) SYRINGE FOR IV PUSH (FOR BLOOD PRESSURE SUPPORT)
PREFILLED_SYRINGE | INTRAVENOUS | Status: AC
  Filled 2017-06-16: qty 10

## 2017-06-16 MED ORDER — ONDANSETRON HCL 4 MG/2ML IJ SOLN
INTRAMUSCULAR | Status: DC | PRN
  Administered 2017-06-16: 4 mg via INTRAVENOUS

## 2017-06-16 MED ORDER — FENTANYL CITRATE (PF) 100 MCG/2ML IJ SOLN
INTRAMUSCULAR | Status: AC
  Filled 2017-06-16: qty 2

## 2017-06-16 MED ORDER — DEXAMETHASONE SODIUM PHOSPHATE 10 MG/ML IJ SOLN
INTRAMUSCULAR | Status: DC | PRN
  Administered 2017-06-16: 10 mg via INTRAVENOUS

## 2017-06-16 MED ORDER — LACTATED RINGERS IV SOLN: INTRAVENOUS | Status: DC

## 2017-06-16 MED ORDER — PROPOFOL 10 MG/ML IV BOLUS
INTRAVENOUS | Status: DC | PRN
  Administered 2017-06-16: 120 mg via INTRAVENOUS

## 2017-06-16 MED ORDER — ROCURONIUM BROMIDE 10 MG/ML (PF) SYRINGE
PREFILLED_SYRINGE | INTRAVENOUS | Status: AC
  Filled 2017-06-16: qty 5

## 2017-06-16 MED ORDER — CIPROFLOXACIN IN D5W 400 MG/200ML IV SOLN: 400.0000 mg | INTRAVENOUS | Status: DC

## 2017-06-16 MED ORDER — MIDAZOLAM HCL 2 MG/2ML IJ SOLN
INTRAMUSCULAR | Status: AC
  Filled 2017-06-16: qty 2

## 2017-06-16 MED ORDER — SUCCINYLCHOLINE CHLORIDE 200 MG/10ML IV SOSY
PREFILLED_SYRINGE | INTRAVENOUS | Status: AC
  Filled 2017-06-16: qty 10

## 2017-06-16 MED ORDER — PHENYLEPHRINE HCL 10 MG/ML IJ SOLN
INTRAMUSCULAR | Status: DC | PRN
  Administered 2017-06-16: 120 ug via INTRAVENOUS
  Administered 2017-06-16: 40 ug via INTRAVENOUS

## 2017-06-16 MED ORDER — SUCCINYLCHOLINE CHLORIDE 20 MG/ML IJ SOLN
INTRAMUSCULAR | Status: DC | PRN
  Administered 2017-06-16: 120 mg via INTRAVENOUS

## 2017-06-16 MED ORDER — MEPERIDINE HCL 50 MG/ML IJ SOLN
6.2500 mg | INTRAMUSCULAR | Status: DC | PRN
  Administered 2017-06-16: 12.5 mg via INTRAVENOUS

## 2017-06-16 MED ORDER — SUGAMMADEX SODIUM 200 MG/2ML IV SOLN
INTRAVENOUS | Status: AC
  Filled 2017-06-16: qty 2

## 2017-06-16 MED ORDER — MIDAZOLAM HCL 5 MG/5ML IJ SOLN
INTRAMUSCULAR | Status: DC | PRN
  Administered 2017-06-16: 2 mg via INTRAVENOUS

## 2017-06-16 MED ORDER — LACTATED RINGERS IV SOLN
INTRAVENOUS | Status: DC | PRN
  Administered 2017-06-16: 09:00:00 via INTRAVENOUS

## 2017-06-16 MED ORDER — SODIUM CHLORIDE 0.9 % IR SOLN
Status: DC | PRN
  Administered 2017-06-16: 6000 mL

## 2017-06-16 MED ORDER — IOHEXOL 300 MG/ML  SOLN
INTRAMUSCULAR | Status: DC | PRN
  Administered 2017-06-16: 15 mL via URETHRAL

## 2017-06-16 MED ORDER — LIDOCAINE HCL (CARDIAC) 20 MG/ML IV SOLN
INTRAVENOUS | Status: DC | PRN
  Administered 2017-06-16: 50 mg via INTRAVENOUS

## 2017-06-16 MED ORDER — LIDOCAINE 2% (20 MG/ML) 5 ML SYRINGE
INTRAMUSCULAR | Status: AC
  Filled 2017-06-16: qty 5

## 2017-06-16 MED ORDER — HYDROMORPHONE HCL 1 MG/ML IJ SOLN: 0.2500 mg | INTRAMUSCULAR | Status: DC | PRN

## 2017-06-16 MED ORDER — MEPERIDINE HCL 50 MG/ML IJ SOLN
INTRAMUSCULAR | Status: AC
  Administered 2017-06-16: 12.5 mg via INTRAVENOUS
  Filled 2017-06-16: qty 1

## 2017-06-16 MED ORDER — ONDANSETRON HCL 4 MG/2ML IJ SOLN
INTRAMUSCULAR | Status: AC
  Filled 2017-06-16: qty 2

## 2017-06-16 MED ORDER — DEXAMETHASONE SODIUM PHOSPHATE 10 MG/ML IJ SOLN
INTRAMUSCULAR | Status: AC
  Filled 2017-06-16: qty 1

## 2017-06-16 MED ORDER — ROCURONIUM BROMIDE 100 MG/10ML IV SOLN
INTRAVENOUS | Status: DC | PRN
  Administered 2017-06-16: 40 mg via INTRAVENOUS

## 2017-06-16 MED ORDER — PROPOFOL 10 MG/ML IV BOLUS
INTRAVENOUS | Status: AC
  Filled 2017-06-16: qty 20

## 2017-06-16 MED ORDER — SUGAMMADEX SODIUM 200 MG/2ML IV SOLN
INTRAVENOUS | Status: DC | PRN
  Administered 2017-06-16: 200 mg via INTRAVENOUS

## 2017-06-16 MED ORDER — PROMETHAZINE HCL 25 MG/ML IJ SOLN
6.2500 mg | INTRAMUSCULAR | Status: DC | PRN
Start: 2017-06-16 — End: 2017-06-16

## 2017-06-16 SURGICAL SUPPLY — 46 items
APL SKNCLS STERI-STRIP NONHPOA (GAUZE/BANDAGES/DRESSINGS) ×1
BAG URINE DRAINAGE (UROLOGICAL SUPPLIES) ×1 IMPLANT
BASKET DAKOTA 1.9FR 11X120 (BASKET) ×2 IMPLANT
BASKET ZERO TIP NITINOL 2.4FR (BASKET) IMPLANT
BENZOIN TINCTURE PRP APPL 2/3 (GAUZE/BANDAGES/DRESSINGS) ×6 IMPLANT
BLADE SURG 15 STRL LF DISP TIS (BLADE) ×1 IMPLANT
BLADE SURG 15 STRL SS (BLADE)
BSKT STON RTRVL ZERO TP 2.4FR (BASKET)
CATCHER STONE W/TUBE ADAPTER (UROLOGICAL SUPPLIES) IMPLANT
CATH AINSWORTH 30CC 24FR (CATHETERS) ×1 IMPLANT
CATH ROBINSON RED A/P 20FR (CATHETERS) IMPLANT
CATH URET 5FR 28IN OPEN ENDED (CATHETERS) IMPLANT
CATH URET DUAL LUMEN 6-10FR 50 (CATHETERS) ×1 IMPLANT
CATH X-FORCE N30 NEPHROSTOMY (TUBING) ×3 IMPLANT
COVER SURGICAL LIGHT HANDLE (MISCELLANEOUS) IMPLANT
DRAPE C-ARM 42X120 X-RAY (DRAPES) ×3 IMPLANT
DRAPE LINGEMAN PERC (DRAPES) ×3 IMPLANT
DRAPE SURG IRRIG POUCH 19X23 (DRAPES) ×3 IMPLANT
DRSG PAD ABDOMINAL 8X10 ST (GAUZE/BANDAGES/DRESSINGS) ×6 IMPLANT
DRSG TEGADERM 8X12 (GAUZE/BANDAGES/DRESSINGS) ×4 IMPLANT
EXTRACTOR STONE NITINOL NGAGE (UROLOGICAL SUPPLIES) IMPLANT
FIBER LASER FLEXIVA 365 (UROLOGICAL SUPPLIES) IMPLANT
FIBER LASER TRAC TIP (UROLOGICAL SUPPLIES) IMPLANT
GAUZE SPONGE 4X4 12PLY STRL (GAUZE/BANDAGES/DRESSINGS) ×1 IMPLANT
GLOVE SURG SS PI 8.0 STRL IVOR (GLOVE) ×9 IMPLANT
GOWN STRL REUS W/TWL XL LVL3 (GOWN DISPOSABLE) ×1 IMPLANT
GUIDEWIRE AMPLAZ .035X145 (WIRE) ×6 IMPLANT
KIT BASIN OR (CUSTOM PROCEDURE TRAY) ×3 IMPLANT
MANIFOLD NEPTUNE II (INSTRUMENTS) ×3 IMPLANT
MASK EYE SHIELD (GAUZE/BANDAGES/DRESSINGS) ×1 IMPLANT
NS IRRIG 1000ML POUR BTL (IV SOLUTION) ×3 IMPLANT
PACK CYSTO (CUSTOM PROCEDURE TRAY) ×3 IMPLANT
PROBE LITHOCLAST ULTRA 3.8X403 (UROLOGICAL SUPPLIES) IMPLANT
PROBE PNEUMATIC 1.0MMX570MM (UROLOGICAL SUPPLIES) IMPLANT
SET IRRIG Y TYPE TUR BLADDER L (SET/KITS/TRAYS/PACK) ×1 IMPLANT
SPONGE LAP 4X18 X RAY DECT (DISPOSABLE) ×3 IMPLANT
STONE CATCHER W/TUBE ADAPTER (UROLOGICAL SUPPLIES) IMPLANT
SUT SILK 2 0 30  PSL (SUTURE)
SUT SILK 2 0 30 PSL (SUTURE) ×1 IMPLANT
SYR 10ML LL (SYRINGE) ×3 IMPLANT
SYR 20CC LL (SYRINGE) ×6 IMPLANT
TOWEL OR 17X26 10 PK STRL BLUE (TOWEL DISPOSABLE) ×3 IMPLANT
TOWEL OR NON WOVEN STRL DISP B (DISPOSABLE) ×1 IMPLANT
TRAY FOLEY W/METER SILVER 16FR (SET/KITS/TRAYS/PACK) ×3 IMPLANT
TUBING CONNECTING 10 (TUBING) ×4 IMPLANT
TUBING CONNECTING 10' (TUBING) ×2

## 2017-06-16 NOTE — Interval H&P Note (Signed)
History and Physical Interval Note:  She has no new complaints.   For second look today for residual fragments seen on post op KUB.  06/16/2017 8:37 AM  Angela Santos  has presented today for surgery, with the diagnosis of RIGHT RENAL STONE  The various methods of treatment have been discussed with the patient and family. After consideration of risks, benefits and other options for treatment, the patient has consented to  Procedure(s): SECOND LOOK NEPHROLITHOTOMY PERCUTANEOUS (Right) as a surgical intervention .  The patient's history has been reviewed, patient examined, no change in status, stable for surgery.  I have reviewed the patient's chart and labs.  Questions were answered to the patient's satisfaction.     Bjorn PippinJohn [name]

## 2017-06-16 NOTE — Anesthesia Preprocedure Evaluation (Addendum)
Anesthesia Evaluation  Patient identified by MRN, date of birth, ID band Patient awake    Reviewed: Allergy & Precautions, NPO status , Patient's Chart, lab work & pertinent test results  History of Anesthesia Complications (+) PONV and history of anesthetic complications  Airway Mallampati: I  TM Distance: >3 FB Neck ROM: Full    Dental  (+) Teeth Intact, Dental Advisory Given   Pulmonary Current Smoker,    breath sounds clear to auscultation       Cardiovascular + dysrhythmias  Rhythm:Regular Rate:Normal     Neuro/Psych PSYCHIATRIC DISORDERS Anxiety Depression    GI/Hepatic negative GI ROS, Neg liver ROS,   Endo/Other  negative endocrine ROS  Renal/GU Renal InsufficiencyRenal disease  negative genitourinary   Musculoskeletal negative musculoskeletal ROS (+)   Abdominal Normal abdominal exam  (+)   Peds  Hematology negative hematology ROS (+)   Anesthesia Other Findings   Reproductive/Obstetrics                            Lab Results  Component Value Date   WBC 3.4 (L) 06/14/2017   HGB 11.9 (L) 06/15/2017   HCT 34.1 (L) 06/15/2017   MCV 89.8 06/14/2017   PLT 193 06/14/2017   Lab Results  Component Value Date   CREATININE 0.86 06/14/2017   BUN 31 (H) 06/14/2017   NA 142 06/14/2017   K 4.1 06/14/2017   CL 109 06/14/2017   CO2 22 06/14/2017   Lab Results  Component Value Date   INR 0.94 06/14/2017   EKG: sinus bradycardia, incomplete RBBB.   Anesthesia Physical Anesthesia Plan  ASA: II  Anesthesia Plan: General   Post-op Pain Management:    Induction: Intravenous  PONV Risk Score and Plan: 4 or greater and Ondansetron, Dexamethasone, Midazolam and Scopolamine patch - Pre-op  Airway Management Planned: Oral ETT  Additional Equipment: None  Intra-op Plan:   Post-operative Plan: Extubation in OR  Informed Consent: I have reviewed the patients History and  Physical, chart, labs and discussed the procedure including the risks, benefits and alternatives for the proposed anesthesia with the patient or authorized representative who has indicated his/her understanding and acceptance.   Dental advisory given  Plan Discussed with: CRNA  Anesthesia Plan Comments:       Anesthesia Quick Evaluation

## 2017-06-16 NOTE — Discharge Instructions (Signed)
Percutaneous Nephrolithotomy, Care After °Refer to this sheet in the next few weeks. These instructions provide you with information about caring for yourself after your procedure. Your health care provider may also give you more specific instructions. Your treatment has been planned according to current medical practices, but problems sometimes occur. Call your health care provider if you have any problems or questions after your procedure. °What can I expect after the procedure? °After the procedure, it is common to have: °· Soreness or pain. °· Fatigue. °· Some blood in your urine for a few days. ° °Follow these instructions at home: °Incision care ° °· Follow instructions from your health care provider about how to take care of your cut from surgery (incision). Make sure you: °? Wash your hands with soap and water before you change your bandage (dressing). If soap and water are not available, use hand sanitizer. °? Change your dressing as told by your health care provider. °? Leave stitches (sutures), skin glue, or adhesive strips in place. These skin closures may need to stay in place for 2 weeks or longer. If adhesive strip edges start to loosen and curl up, you may trim the loose edges. Do not remove adhesive strips completely unless your health care provider tells you to do that. °· Check your incision area every day for signs of infection. Check for: °? More redness, swelling, or pain. °? More fluid or blood. °? Warmth. °? Pus or a bad smell. °Activity °· Avoid strenuous activities for as long as told by your health care provider. °· Return to your normal activities as told by your health care provider. Ask your health care provider what activities are safe for you. °General instructions °· Take over-the-counter and prescription medicines only as told by your health care provider. °· Do not drive or operate heavy machinery while taking prescription pain medicine. °· Keep all follow-up visits as told by your  health care provider. This is important. °· You may have been sent home with a catheter or kidney drain tube. If so, carefully follow your health care provider’s instructions on how to take care of your catheter or kidney drain tube. °Contact a health care provider if: °· You have a fever. °· You have more redness, swelling, or pain around your incision. °· You have more fluid or blood coming from your incision. °· Your incision feels warm to the touch. °· You have pus or a bad smell coming from your incision. °· You lose your appetite. °· You feel nauseous or you vomit. °Get help right away if: °· You have blood clots in your urine. °· You cannot urinate. °· You have chest pain or trouble breathing. °This information is not intended to replace advice given to you by your health care provider. Make sure you discuss any questions you have with your health care provider. °Document Released: 05/02/2015 Document Revised: 09/11/2015 Document Reviewed: 09/23/2014 °Elsevier Interactive Patient Education © 2018 [employer] ° °

## 2017-06-16 NOTE — Plan of Care (Signed)
  Progressing Skin Integrity: Demonstration of wound healing without infection will improve 06/16/2017 0810 - Progressing by Elize Pinon, Alben SpittleMary E, RN Clinical Measurements: Ability to maintain clinical measurements within normal limits will improve 06/16/2017 0810 - Progressing by Oswaldo Cueto, Alben SpittleMary E, RN Will remain free from infection 06/16/2017 0810 - Progressing by Lilliam Chamblee, Alben SpittleMary E, RN Diagnostic test results will improve 06/16/2017 0810 - Progressing by Pebbles Zeiders, Alben SpittleMary E, RN Activity: Risk for activity intolerance will decrease 06/16/2017 0810 - Progressing by Taiana Temkin, Alben SpittleMary E, RN Nutrition: Adequate nutrition will be maintained 06/16/2017 0810 - Progressing by Haileigh Pitz, Alben SpittleMary E, RN Coping: Level of anxiety will decrease 06/16/2017 0810 - Progressing by Kolina Kube-Ann Pennella, Alben SpittleMary E, RN Elimination: Will not experience complications related to urinary retention 06/16/2017 0810 - Progressing by Allye Hoyos, Alben SpittleMary E, RN Pain Managment: General experience of comfort will improve 06/16/2017 0810 - Progressing by Mendel Binsfeld, Alben SpittleMary E, RN Safety: Ability to remain free from injury will improve 06/16/2017 0810 - Progressing by Mariafernanda Hendricksen, Alben SpittleMary E, RN Skin Integrity: Risk for impaired skin integrity will decrease 06/16/2017 0810 - Progressing by Khyle Goodell, Alben SpittleMary E, RN

## 2017-06-16 NOTE — Anesthesia Procedure Notes (Signed)
Procedure Name: Intubation Date/Time: 06/16/2017 9:21 AM Performed by: Colin Rhein, CRNA Pre-anesthesia Checklist: Patient identified, Emergency Drugs available, Suction available and Patient being monitored Patient Re-evaluated:Patient Re-evaluated prior to induction Oxygen Delivery Method: Circle system utilized Preoxygenation: Pre-oxygenation with 100% oxygen Induction Type: IV induction Ventilation: Mask ventilation without difficulty Laryngoscope Size: Mac and 4 Grade View: Grade I Tube type: Oral Tube size: 7.5 mm Number of attempts: 1 Airway Equipment and Method: Patient positioned with wedge pillow Placement Confirmation: ETT inserted through vocal cords under direct vision,  positive ETCO2,  CO2 detector and breath sounds checked- equal and bilateral Secured at: 21 cm Tube secured with: Tape Dental Injury: Teeth and Oropharynx as per pre-operative assessment

## 2017-06-16 NOTE — Transfer of Care (Signed)
Immediate Anesthesia Transfer of Care Note  Patient: Angela Santos  Procedure(s) Performed: SECOND LOOK NEPHROLITHOTOMY PERCUTANEOUS (Right )  Patient Location: PACU  Anesthesia Type:General  Level of Consciousness: awake, alert  and oriented  Airway & Oxygen Therapy: Patient Spontanous Breathing and Patient connected to face mask oxygen  Post-op Assessment: Report given to RN and Post -op Vital signs reviewed and stable  Post vital signs: Reviewed and stable  Last Vitals:  Vitals:   06/15/17 2120 06/16/17 0623  BP: 99/61 (!) 104/59  Pulse: 70 68  Resp: 20 18  Temp: 37.1 C 36.9 C  SpO2: 98% 96%    Last Pain:  Vitals:   06/16/17 0845  TempSrc:   PainSc: 0-No pain      Patients Stated Pain Goal: 4 (06/16/17 0845)  Complications: No apparent anesthesia complications

## 2017-06-16 NOTE — Anesthesia Postprocedure Evaluation (Signed)
Anesthesia Post Note  Patient: Janeece FittingSandra W Brandy  Procedure(s) Performed: SECOND LOOK NEPHROLITHOTOMY PERCUTANEOUS (Right )     Patient location during evaluation: PACU Anesthesia Type: General Level of consciousness: awake and alert Pain management: pain level controlled Vital Signs Assessment: post-procedure vital signs reviewed and stable Respiratory status: spontaneous breathing, nonlabored ventilation, respiratory function stable and patient connected to nasal cannula oxygen Cardiovascular status: blood pressure returned to baseline and stable Postop Assessment: no apparent nausea or vomiting Anesthetic complications: no    Last Vitals:  Vitals:   06/16/17 1100 06/16/17 1120  BP: (!) 102/55 (!) 104/54  Pulse: 66 69  Resp: 18 16  Temp:  36.6 C  SpO2: 94% 97%    Last Pain:  Vitals:   06/16/17 1100  TempSrc:   PainSc: Asleep                 Shelton SilvasKevin D Melenda Bielak

## 2017-06-16 NOTE — Op Note (Signed)
Procedure: Second look right percutaneous nephrolithotomy with antegrade nephrostogram.  Preop diagnosis: Residual right stone fragments.  Postop diagnosis: Deep Randall's plaques without accessible stones.  Surgeon: Dr. Bjorn PippinJohn Ayden Apodaca.  Anesthesia: General.  Drains: Foley.  EBL: None.  Specimen: None.  Complications: None.  Indications: Angela DavenportSandra underwent a right percutaneous nephrolithotomy for a 3 cm renal pelvic stone on 06/14/2017.  Postoperative KUB demonstrated a residual 7 mm stone in the right mid upper pole and 3 mm stone in the upper pole and it was felt a second look was indicated.  I had inspected her thoroughly at the time of the procedure and did not find the stones but felt now that the tract was mature it was appropriate to look again.  Procedure: She was given Cipro.  She was taken operating room where general anesthetic was induced on the holding room stretcher.  PAS hose were placed.  A Foley catheter was inserted.  She was then rolled prone onto chest rolls with all pressure points padded.  Her nephrostomy site with tubes was prepped with ChloraPrep.  The large nephrostomy tube was removed and a dressing was applied.  The flexible nephroscope was then passed through the tract alongside the safety catheter and thorough inspection of the collecting system was performed.  Some small Randall's plaques were identified in the upper mid pole calyceal systems but no free stones were identified.  An old clot was removed from the tract but no stones were contained within the clot.  Contrast was instilled through the cystoscope.  The antegrade nephrostogram demonstrated no extravasation or filling defects and outlined the anatomy to better inspect the entire collecting system.  The nephroscope was reinserted and advanced and all contrast filled areas in the upper and mid pole but no stones were seen in the calcification that had been noted on KUB and was easily seen on fluoroscopy  appeared to be outside of the contrast-filled areas.  Reexamination of the CT films suggested that the stone parenchyma or possibly in a stenosis infundibulum but I was not able to identify any access to the stone.  The upper pole calcification may well be extrarenal.  At this point the scope was removed the safety catheter was removed.  The drapes were removed and a dressing was applied.  The patient was rolled supine on the right and her Foley catheter was removed.  Her anesthetic was reversed, she was taken to recovery room in stable condition and there were no complications.

## 2017-06-16 NOTE — Discharge Summary (Signed)
Physician Discharge Summary  Patient ID: Angela Santos MRN: [id number] DOB/AGE: 08/15/1965 52 y.o.  Admit date: 06/14/2017 Discharge date: 06/16/2017  Admission Diagnoses:  Right nephrolithiasis.  Discharge Diagnoses:  Active Problems:   Nephrolithiasis   Past Medical History:  Diagnosis Date  . Depression   . History of syncope    RECURRENT 09/ 2015  VASOVAGAL --  NORMAL HOLTER MONITOR PER DR Verdis Prime CARDIOLOGIST  . Incomplete right bundle branch block   . Nephrolithiasis    BILATERAL  RIGHT > LEFT  . PONV (postoperative nausea and vomiting) 2015    Surgeries: Procedure(s): FIRST LOOK PERCUTANEOUS NEPHROLITHOTOMY on 06/14/17 SECOND LOOK NEPHROLITHOTOMY PERCUTANEOUS on 06/16/2017   Consultants (if any):   Discharged Condition: Improved  Hospital Course: Angela Santos is an 52 y.o. female who was admitted 06/14/2017 with a diagnosis of a 3cm right renal pelvic stone with smaller surrounding stones and went to the operating room on 06/14/2017 and underwent the above named procedure.   There were some residual stones on a postop KUB on 2/27 and she was returned to the OR on 2/28 for a second look.  The residual stones were noted to be intraparenchymal and couldn't be accessed.  Her tubes were removed and she was felt to be ready for discharge.   She had only a minimal post op anemia.     She was given perioperative antibiotics:  Anti-infectives (From admission, onward)   Start     Dose/Rate Route Frequency Ordered Stop   06/16/17 0711  ciprofloxacin (CIPRO) IVPB 400 mg  Status:  Discontinued     400 mg 200 mL/hr over 60 Minutes Intravenous 60 min pre-op 06/16/17 0711 06/16/17 1116   06/14/17 2200  ciprofloxacin (CIPRO) IVPB 400 mg  Status:  Discontinued     400 mg 200 mL/hr over 60 Minutes Intravenous Every 12 hours 06/14/17 1639 06/16/17 1620   06/14/17 0830  ciprofloxacin (CIPRO) IVPB 400 mg     400 mg 200 mL/hr over 60 Minutes Intravenous To Radiology 06/14/17 0745 06/14/17  1051   06/14/17 0747  ciprofloxacin (CIPRO) IVPB 400 mg  Status:  Discontinued     400 mg 200 mL/hr over 60 Minutes Intravenous 60 min pre-op 06/14/17 0747 06/14/17 0750    .  She was given sequential compression devices and early ambulation for DVT prophylaxis.  She benefited maximally from the hospital stay and there were no complications.    Recent vital signs:  Vitals:   06/16/17 1100 06/16/17 1120  BP: (!) 102/55 (!) 104/54  Pulse: 66 69  Resp: 18 16  Temp:  97.9 F (36.6 C)  SpO2: 94% 97%    Recent laboratory studies:  Lab Results  Component Value Date   HGB 11.9 (L) 06/15/2017   HGB 12.5 06/14/2017   HGB 13.6 06/14/2017   Lab Results  Component Value Date   WBC 3.4 (L) 06/14/2017   PLT 193 06/14/2017   Lab Results  Component Value Date   INR 0.94 06/14/2017   Lab Results  Component Value Date   NA 142 06/14/2017   K 4.1 06/14/2017   CL 109 06/14/2017   CO2 22 06/14/2017   BUN 31 (H) 06/14/2017   CREATININE 0.86 06/14/2017   GLUCOSE 98 06/14/2017    Discharge Medications:   Allergies as of 06/16/2017      Reactions   Penicillins Rash   Has patient had a PCN reaction causing immediate rash, facial/tongue/throat swelling, SOB or lightheadedness with hypotension: Yes  Has patient had a PCN reaction causing severe rash involving mucus membranes or skin necrosis: No Has patient had a PCN reaction that required hospitalization: No Has patient had a PCN reaction occurring within the last 10 years: No If all of the above answers are "NO", then may proceed with Cephalosporin use.      Medication List    TAKE these medications   diphenhydramine-acetaminophen 25-500 MG Tabs tablet Commonly known as:  TYLENOL PM Take 1 tablet by mouth at bedtime as needed (sleep).   HYDROcodone-acetaminophen 7.5-325 MG tablet Commonly known as:  NORCO Take 1 tablet by mouth every 6 (six) hours as needed for moderate pain.   ibuprofen 200 MG tablet Commonly known as:   ADVIL,MOTRIN Take 200 mg by mouth 2 (two) times daily as needed (Pain).   ondansetron 4 MG tablet Commonly known as:  ZOFRAN Take 4 mg by mouth every 6 (six) hours as needed for nausea or vomiting.       Diagnostic Studies: Dg Abd 1 View  Result Date: 06/15/2017 CLINICAL DATA:  Nephrolithiasis. EXAM: ABDOMEN - 1 VIEW COMPARISON:  Fluoroscopic images of June 14, 2017. FINDINGS: Large caliber nephrostomy catheter is noted projected over right kidney. Smaller nephroureteral stent is seen with distal tip in expected position of distal right ureter or urinary bladder. Small calculi are noted in the upper pole of the right kidney. No evidence of bowel obstruction or inflammation. IMPRESSION: Large caliber right nephrostomy catheter is noted, with smaller bore right nephroureteral stent present. Mild nephrolithiasis is noted in upper pole of right kidney. Electronically Signed   By: Lupita Raider, M.D.   On: 06/15/2017 09:38   Dg C-arm 1-60 Min-no Report  Result Date: 06/16/2017 Fluoroscopy was utilized by the requesting physician.  No radiographic interpretation.   Dg C-arm 1-60 Min-no Report  Result Date: 06/14/2017 Fluoroscopy was utilized by the requesting physician.  No radiographic interpretation.   Ir Ureteral Stent Right New Access W/o Sep Nephrostomy Cath  Result Date: 06/14/2017 INDICATION: Renal stones, access for right-sided percutaneous nephrolithotomy. EXAM: 1. ULTRASOUND AND FLUOROSCOPIC GUIDANCE FOR PUNCTURE OF THE RIGHT SIDED RENAL COLLECTING SYSTEM. 2. FLUOROSCOPIC GUIDED PLACEMENT OF A RIGHT SIDED NEPHROURETERAL CATHETER. COMPARISON:  CT of the abdomen and pelvis - 05/19/2017 MEDICATIONS: Ciprofloxacin 400 mg IV; The antibiotic was administered in an appropriate time frame prior to skin puncture. ANESTHESIA/SEDATION: Moderate (conscious) sedation was employed during this procedure. A total of Versed 3 mg and Fentanyl 150 mcg was administered intravenously. Moderate Sedation  Time: 12 minutes. The patient's level of consciousness and vital signs were monitored continuously by radiology nursing throughout the procedure under my direct supervision. CONTRAST:  A total of 15 cc Isovue-300 was administered into the renal collecting system. FLUOROSCOPY TIME:  1 minutes 48 seconds (11 mGy) COMPLICATIONS: None immediate. PROCEDURE: Informed written consent was obtained from the patient after a discussion of the risks, benefits, and alternatives to treatment. The right flank region was prepped with Betadine in a sterile fashion, and a sterile drape was applied covering the operative field. A sterile gown and sterile gloves were used for the procedure. A timeout was performed prior to the initiation of the procedure. A pre procedural spot fluoroscopic image was obtained of the upper abdomen. Ultrasound scanning demonstrates mild right-sided pelvicaliectasis as well as an echogenic structure within the inferior aspect of the right renal pelvis compatible with known dominant pelvic renal stone. Under direct ultrasound guidance, a mildly dilated posterior inferior calyx was targeted. Ultrasound images  were saved for procedural documentation purposes. Access to the collecting system was confirmed with the injection of a small amount of contrast as well as advancement of a Nitrex wire into the collecting system, past the pelvic stone and into the superior aspect of the right ureter. Under intermittent fluoroscopic guidance, the track was dilated with an Accustick set. Contrast injection confirmed appropriate positioning. Next, the Accustick dilator was exchanged for a Kumpe the catheter which was advanced through the right ureter to the level of the urinary bladder. Contrast injection confirmed appropriate positioning. Postprocedural spot fluoroscopic and radiographs were obtained in various obliquities and the catheter was sutured to the skin. The catheter was capped and a dressing was placed. The  patient tolerated the procedure well without immediate post procedural complication. FINDINGS: Pre procedural spot radiographic images demonstrates an approximately 2.5 x 1.7 cm stone overlying the expected location of the inferior aspect of the right renal pelvis. There is made of 2 additional ill-defined opacities overlying the right renal pelvis which are favored to represent additional nonobstructing renal stones with dominant opacity measuring approximately 0.5 cm. Ultrasound scanning demonstrates mild right-sided pelvicaliectasis. A mildly dilated posterior inferior calyx was successfully accessed with direct ultrasound guidance. Contrast injection confirmed appropriate access to the collecting system, ultimately allowing placement of a Kumpe catheter through this posterior inferior calyx, past the pelvic stone, through the right ureter with tip terminating within the urinary bladder. IMPRESSION: Successful ultrasound and fluoroscopic guided placement of a right sided 5 [place] Kumpe catheter to the level of the urinary bladder to be utilized during impending nephrolithotomy procedure. Electronically Signed   By: Simonne ComeJohn  Watts M.D.   On: 06/14/2017 11:25    Disposition: 01-Home or Self Care    Follow-up Information    Jetta LoutWarden, Diane, NP On 06/23/2017.   Specialty:  Urology Why:  [address] Contact information: [address] [place] [id number] [phone]            Signed: Bjorn PippinJohn Ira Dougher 06/16/2017, 5:11 PM

## 2017-06-17 ENCOUNTER — Encounter (HOSPITAL_COMMUNITY): Payer: Self-pay | Admitting: Urology

## 2017-06-18 LAB — TYPE AND SCREEN
ABO/RH(D): A POS
ANTIBODY SCREEN: POSITIVE
Unit division: 0

## 2017-06-18 LAB — BPAM RBC
BLOOD PRODUCT EXPIRATION DATE: 201903282359
Unit Type and Rh: 5100

## 2017-07-29 ENCOUNTER — Other Ambulatory Visit: Payer: Self-pay | Admitting: Family Medicine

## 2017-07-29 ENCOUNTER — Ambulatory Visit
Admission: RE | Admit: 2017-07-29 | Discharge: 2017-07-29 | Disposition: A | Discharge status: Home / Self Care | Payer: PRIVATE HEALTH INSURANCE | Source: Ambulatory Visit | Attending: Family Medicine | Admitting: Family Medicine

## 2017-07-29 DIAGNOSIS — M791 Myalgia, unspecified site: Secondary | ICD-10-CM

## 2017-07-29 DIAGNOSIS — N644 Mastodynia: Secondary | ICD-10-CM

## 2017-08-03 ENCOUNTER — Ambulatory Visit
Admission: RE | Admit: 2017-08-03 | Discharge: 2017-08-03 | Disposition: A | Discharge status: Home / Self Care | Payer: 59 | Source: Ambulatory Visit | Attending: Family Medicine | Admitting: Family Medicine

## 2017-08-03 ENCOUNTER — Ambulatory Visit: Payer: 59

## 2017-08-03 DIAGNOSIS — N644 Mastodynia: Secondary | ICD-10-CM

## 2017-10-17 DIAGNOSIS — N200 Calculus of kidney: Secondary | ICD-10-CM | POA: Diagnosis not present

## 2017-10-18 ENCOUNTER — Other Ambulatory Visit: Payer: Self-pay | Admitting: Urology

## 2017-10-18 DIAGNOSIS — N135 Crossing vessel and stricture of ureter without hydronephrosis: Secondary | ICD-10-CM

## 2017-10-31 ENCOUNTER — Encounter (HOSPITAL_COMMUNITY): Payer: 59

## 2017-11-07 DIAGNOSIS — R310 Gross hematuria: Secondary | ICD-10-CM | POA: Diagnosis not present

## 2017-11-07 DIAGNOSIS — N200 Calculus of kidney: Secondary | ICD-10-CM | POA: Diagnosis not present

## 2017-11-08 ENCOUNTER — Other Ambulatory Visit: Payer: Self-pay | Admitting: Urology

## 2017-11-14 DIAGNOSIS — N200 Calculus of kidney: Secondary | ICD-10-CM | POA: Diagnosis not present

## 2017-11-23 NOTE — H&P (Signed)
CC: I have kidney stones.  HPI: Angela Santos is a 52 year-old female established patient who is here for renal calculi.  This is not her first kidney stone. She has had more than 5 stones prior to getting this one. She is not currently having flank pain, back pain, groin pain, nausea, vomiting, fever or chills. She has not caught a stone in her urine strainer since her symptoms began.   She has had ureteral stent, ureteroscopy, and percutaneous lithotripsy for treatment of her stones in the past.   Angela Santos returns today in f/u for her history of stones. She had some hematuria over the last few days but no pain. She is about 8 months out from a right PCNL. She has a history of stones with a right UPJ stricture and was treated in November 2015 with ureteroscopic stone extraction and stenting. She had a good antegrade flow on the right nephrostogram at the time of the PCNL on 06/14/17 but there is a concern about a proximal stricture. I wanted to get a renogram but it was not approved so she is to have a renal US today.      ALLERGIES: Penicillins    MEDICATIONS: Hydrocodone-Acetaminophen 7.5 mg-325 mg tablet 1 tablet PO Q 6 H  Zofran 4 mg tablet 1 tablet PO Q 6 H PRN     GU PSH: Cysto Uretero Lithotripsy - 2015 Cysto/uretero W/up Stricture - 2015 Cystoscopy Insert Stent - 2015, 2015, 2012 Endoscopy via Nephrost Tube, Right - 06/16/2017 ESWL - 2015, 2012, 2010, 2009 Percut Stone Removal >2cm, Right - 06/14/2017 Ureteroscopic stone removal - 2015    NON-GU PSH: None   GU PMH: Renal calculus (Stable), She has stable bilateral upper pole stones. I will get a KUB in a year. - 10/17/2017, (Worsening), Right, She has a 2 x 3cm right mid renal stone that is probably pure CaOx monohydrate. I am going to get a CT urogram to better clarify her anatomy and will get her set up for PCNL. I reviewed the risks of bleeding, infection, injury to the kidney and adjacent structures, renal loss, need for secondary  procedures, urine leaks, thrombotic events and anesthetic complications. , - 05/11/2017, Nephrolithiasis, - 2016 Flank Pain, She is having intermittent pain so I have given pain and nausea meds. - 05/11/2017 Ureteral stricture, Ureteral stricture - 2016 Other microscopic hematuria, Microscopic hematuria - 2015 Personal Hx Urinary Tract Infections, History of urinary tract infection - 2015 Ureteral calculus, Calculus of left ureter - 2015, Calculus of right ureter, - 2015 History of urolithiasis, Nephrolithiasis - 2014 Personal Hx Oth Urinary System diseases, History of hematuria - 2014      PMH Notes:  2010-11-10 11:46:45 - Note: Nephrolithiasis Of The Left Kidney   NON-GU PMH: Encounter for general adult medical examination without abnormal findings, Encounter for preventive health examination - 2016    FAMILY HISTORY: Acute Myocardial Infarction - Father Death In The Family Father - Mother Family Health Status - Mother's Age - Runs In Family Family Health Status Number - Runs In Family Heart Disease - Mother Hematuria - Mother nephrolithiasis - Father, Mother   SOCIAL HISTORY: Marital Status: Married Preferred Language: English; Race: White Current Smoking Status: Patient smokes.   Tobacco Use Assessment Completed: Used Tobacco in last 30 days? Drinks 2 caffeinated drinks per day.     Notes: Current every day smoker, Caffeine Use, Alcohol Use, Marital History - Currently Married, Tobacco Use, Occupation:   REVIEW OF SYSTEMS:  GU Review Female:   Patient denies frequent urination, hard to postpone urination, burning /pain with urination, get up at night to urinate, leakage of urine, stream starts and stops, trouble starting your stream, have to strain to urinate, and being pregnant.  Gastrointestinal (Upper):   Patient denies nausea, vomiting, and indigestion/ heartburn.  Gastrointestinal (Lower):   Patient denies diarrhea and constipation.  Constitutional:   Patient denies  fever, night sweats, weight loss, and fatigue.  Skin:   Patient denies itching and skin rash/ lesion.  Eyes:   Patient denies blurred vision and double vision.  Ears/ Nose/ Throat:   Patient denies sore throat and sinus problems.  Hematologic/Lymphatic:   Patient denies swollen glands and easy bruising.  Cardiovascular:   Patient denies leg swelling and chest pains.  Respiratory:   Patient denies cough and shortness of breath.  Endocrine:   Patient denies excessive thirst.  Musculoskeletal:   Patient denies back pain and joint pain.  Neurological:   Patient denies headaches and dizziness.  Psychologic:   Patient denies depression and anxiety.   Notes: blood in urine last week    VITAL SIGNS:      11/07/2017 04:09 PM  Weight 110 lb / 49.9 kg  Height 62 in / 157.48 cm  BP 108/61 mmHg  Heart Rate 62 /min  BMI 20.1 kg/m   MULTI-SYSTEM PHYSICAL EXAMINATION:    Constitutional: Well-nourished. No physical deformities. Normally developed. Good grooming.  Respiratory: No labored breathing, no use of accessory muscles. CTA   Cardiovascular: RRR without murmur.      PAST DATA REVIEWED:  Source Of History:  Patient  Urine Test Review:   Urinalysis  X-Ray Review: KUB: Reviewed Films. Discussed With Patient. 7mm LUPJ stone and stable right renal stones.  Renal Ultrasound: Reviewed Films. Discussed With Patient. Minimal fullness of right renal pelvis with small stones. LLP cyst and a stone obstructing the lower pole of the kidney.     PROCEDURES:         KUB - [id number]  A single view of the abdomen is obtained. Stable right mid and upper pole stones. There is a 7mm left UPJ stone that was 5mm and in the upper pole on the last KUB. There are no ureteral stones. She has normal bone, gas and soft tissue findings.                 Renal Ultrasound - [id number]  Right Kidney: Length:10.79 cm Depth: 4.48 cm Cortical Width: 0.97 cm Width: 4.32 cm  Left Kidney: Length: 10.89 cm Depth: 3.83  cm Cortical Width: 1.32 cm Width: 4.33 cm  Left Kidney/Ureter:  1)Mild Hydro-----2)0.76cm Renal Pelvis Stone-----3)0.37cm Mid Pole Stone-----4)Lower Pole Cystic Area measuring 2.36cm x 2.42cm x 2.59cm-----5) Multiple Subcentimeter hyperechoic areas with and without shadowing - ? stones vs vascular calcifications   Right Kidney/Ureter:  1)Mild Hydro with Dilated Renal Pelvis measuring 1.06cm-----2)0.88cm Upper Pole Stone-----3)? 0.33cm and 0.44cm Mid Pole Stones-----4) Multiple Subcentimeter hyperechoic areas with and without shadowing - ? stones vs vascular calcifications   Bladder:  PVR = 29.5667ml               Urinalysis w/Scope - 81001 Dipstick Dipstick Cont'd Micro  Color: Yellow Bilirubin: Neg WBC/hpf: NS (Not Seen)  Appearance: Cloudy Ketones: Neg RBC/hpf: >60/hpf  Specific Gravity: 1.020 Blood: 3+ Bacteria: NS (Not Seen)  pH: 6.5 Protein: Trace Cystals: NS (Not Seen)  Glucose: Neg Urobilinogen: 0.2 Casts: NS (Not Seen)    Nitrites: Neg Trichomonas: Not  Present    Leukocyte Esterase: Neg Mucous: Present      Epithelial Cells: 0 - 5/hpf      Yeast: NS (Not Seen)      Sperm: Not Present    Notes:      ASSESSMENT:      ICD-10 Details  1 GU:   Renal calculus - N20.0 Worsening - She has a 7mm LUPJ stone with partial renal obstruction and hematuria. We discussed options and will get her set up for ESWL. She is aware of the risks which were reviewed again with her. Her right renal stones are stable with minimal fullness of the pelvis. With her frequently recurring stones, I will get her back on indapamide and will check labs in 1-2 weeks. I discussed hydration and encouraged her to consume lemon Crystal light for the alkali and citrate load.   2   Gross hematuria - R31.0   3   Ureteral stricture - N13.5 The right renal pelvis has mild fullness but no hydro is noted.    PLAN:            Medications New Meds: Indapamide 2.5 mg tablet 1 tablet PO Daily   #90  3 Refill(s)             Orders X-Rays: KUB          Schedule Labs: 1 Week - BMP  Lab Notes: 1-2 weeks after starting indapamide.   Return Visit/Planned Activity: Next Available Appointment - Schedule Surgery             Note: left ESWL.

## 2017-11-24 ENCOUNTER — Encounter (HOSPITAL_COMMUNITY): Payer: Self-pay | Admitting: *Deleted

## 2017-11-24 ENCOUNTER — Encounter (HOSPITAL_COMMUNITY)
Admission: RE | Disposition: A | Discharge status: Home / Self Care | Payer: Self-pay | Source: Other Acute Inpatient Hospital | Attending: Urology

## 2017-11-24 ENCOUNTER — Ambulatory Visit (HOSPITAL_COMMUNITY)
Admission: RE | Admit: 2017-11-24 | Discharge: 2017-11-24 | Disposition: A | Discharge status: Home / Self Care | Payer: 59 | Source: Other Acute Inpatient Hospital | Attending: Urology | Admitting: Urology

## 2017-11-24 ENCOUNTER — Ambulatory Visit (HOSPITAL_COMMUNITY): Payer: 59

## 2017-11-24 ENCOUNTER — Other Ambulatory Visit: Payer: Self-pay

## 2017-11-24 DIAGNOSIS — Z880 Allergy status to penicillin: Secondary | ICD-10-CM | POA: Insufficient documentation

## 2017-11-24 DIAGNOSIS — N200 Calculus of kidney: Secondary | ICD-10-CM | POA: Diagnosis not present

## 2017-11-24 DIAGNOSIS — N135 Crossing vessel and stricture of ureter without hydronephrosis: Secondary | ICD-10-CM | POA: Diagnosis not present

## 2017-11-24 DIAGNOSIS — F17210 Nicotine dependence, cigarettes, uncomplicated: Secondary | ICD-10-CM | POA: Diagnosis not present

## 2017-11-24 DIAGNOSIS — Z01818 Encounter for other preprocedural examination: Secondary | ICD-10-CM | POA: Diagnosis not present

## 2017-11-24 HISTORY — PX: EXTRACORPOREAL SHOCK WAVE LITHOTRIPSY: SHX1557

## 2017-11-24 SURGERY — LITHOTRIPSY, ESWL
Anesthesia: LOCAL | Laterality: Left

## 2017-11-24 MED ORDER — ONDANSETRON HCL 4 MG PO TABS
4.0000 mg | ORAL_TABLET | Freq: Once | ORAL | Status: AC
  Administered 2017-11-24: 4 mg via ORAL
  Filled 2017-11-24: qty 1

## 2017-11-24 MED ORDER — ONDANSETRON 4 MG PO TBDP
ORAL_TABLET | ORAL | Status: AC
  Filled 2017-11-24: qty 1

## 2017-11-24 MED ORDER — ACETAMINOPHEN 650 MG RE SUPP
650.0000 mg | RECTAL | Status: DC | PRN
  Filled 2017-11-24: qty 1

## 2017-11-24 MED ORDER — OXYCODONE HCL 5 MG PO TABS
5.0000 mg | ORAL_TABLET | ORAL | Status: DC | PRN
  Administered 2017-11-24: 5 mg via ORAL
  Filled 2017-11-24: qty 1

## 2017-11-24 MED ORDER — DIPHENHYDRAMINE HCL 25 MG PO CAPS
25.0000 mg | ORAL_CAPSULE | ORAL | Status: AC
  Administered 2017-11-24: 25 mg via ORAL
  Filled 2017-11-24: qty 1

## 2017-11-24 MED ORDER — SODIUM CHLORIDE 0.9 % IV SOLN
INTRAVENOUS | Status: DC
  Administered 2017-11-24: 07:00:00 via INTRAVENOUS

## 2017-11-24 MED ORDER — SODIUM CHLORIDE 0.9% FLUSH: 3.0000 mL | INTRAVENOUS | Status: DC | PRN

## 2017-11-24 MED ORDER — CIPROFLOXACIN HCL 500 MG PO TABS
500.0000 mg | ORAL_TABLET | ORAL | Status: AC
  Administered 2017-11-24: 500 mg via ORAL
  Filled 2017-11-24: qty 1

## 2017-11-24 MED ORDER — DIAZEPAM 5 MG PO TABS
10.0000 mg | ORAL_TABLET | ORAL | Status: AC
  Administered 2017-11-24: 10 mg via ORAL
  Filled 2017-11-24: qty 2

## 2017-11-24 MED ORDER — SODIUM CHLORIDE 0.9 % IV SOLN: 250.0000 mL | INTRAVENOUS | Status: DC | PRN

## 2017-11-24 MED ORDER — MORPHINE SULFATE (PF) 4 MG/ML IV SOLN: 2.0000 mg | INTRAVENOUS | Status: DC | PRN

## 2017-11-24 MED ORDER — SODIUM CHLORIDE 0.9% FLUSH: 3.0000 mL | Freq: Two times a day (BID) | INTRAVENOUS | Status: DC

## 2017-11-24 MED ORDER — ACETAMINOPHEN 325 MG PO TABS: 650.0000 mg | ORAL_TABLET | ORAL | Status: DC | PRN

## 2017-11-24 NOTE — Discharge Instructions (Addendum)
Moderate Conscious Sedation, Adult, Care After °These instructions provide you with information about caring for yourself after your procedure. Your health care provider may also give you more specific instructions. Your treatment has been planned according to current medical practices, but problems sometimes occur. Call your health care provider if you have any problems or questions after your procedure. °What can I expect after the procedure? °After your procedure, it is common: °To feel sleepy for several hours. °To feel clumsy and have poor balance for several hours. °To have poor judgment for several hours. °To vomit if you eat too soon. ° °Follow these instructions at home: °For at least 24 hours after the procedure: ° °Do not: °Participate in activities where you could fall or become injured. °Drive. °Use heavy machinery. °Drink alcohol. °Take sleeping pills or medicines that cause drowsiness. °Make important decisions or sign legal documents. °Take care of children on your own. °Rest. °Eating and drinking °Follow the diet recommended by your health care provider. °If you vomit: °Drink water, juice, or soup when you can drink without vomiting. °Make sure you have little or no nausea before eating solid foods. °General instructions °Have a responsible adult stay with you until you are awake and alert. °Take over-the-counter and prescription medicines only as told by your health care provider. °If you smoke, do not smoke without supervision. °Keep all follow-up visits as told by your health care provider. This is important. °Contact a health care provider if: °You keep feeling nauseous or you keep vomiting. °You feel light-headed. °You develop a rash. °You have a fever. °Get help right away if: °You have trouble breathing. °This information is not intended to replace advice given to you by your health care provider. Make sure you discuss any questions you have with your health care provider. °Document Released:  01/24/2013 Document Revised: 09/08/2015 Document Reviewed: 07/26/2015 °Elsevier Interactive Patient Education © 2018 [employer] °Lithotripsy, Care After °This sheet gives you information about how to care for yourself after your procedure. Your health care provider may also give you more specific instructions. If you have problems or questions, contact your health care provider. °What can I expect after the procedure? °After the procedure, it is common to have: °· Some blood in your urine. This should only last for a few days. °· Soreness in your back, sides, or upper abdomen for a few days. °· Blotches or bruises on your back where the pressure wave entered the skin. °· Pain, discomfort, or nausea when pieces (fragments) of the kidney stone move through the tube that carries urine from the kidney to the bladder (ureter). Stone fragments may pass soon after the procedure, but they may continue to pass for up to 4-8 weeks. °? If you have severe pain or nausea, contact your health care provider. This may be caused by a large stone that was not broken up, and this may mean that you need more treatment. °· Some pain or discomfort during urination. °· Some pain or discomfort in the lower abdomen or (in men) at the base of the penis. ° °Follow these instructions at home: °Medicines °· Take over-the-counter and prescription medicines only as told by your health care provider. °· If you were prescribed an antibiotic medicine, take it as told by your health care provider. Do not stop taking the antibiotic even if you start to feel better. °· Do not drive for 24 hours if you were given a medicine to help you relax (sedative). °· Do not drive   or use heavy machinery while taking prescription pain medicine. °Eating and drinking °· Drink enough water and fluids to keep your urine clear or pale yellow. This helps any remaining pieces of the stone to pass. It can also help prevent new stones from forming. °· Eat plenty of fresh  fruits and vegetables. °· Follow instructions from your health care provider about eating and drinking restrictions. You may be instructed: °? To reduce how much salt (sodium) you eat or drink. Check ingredients and nutrition facts on packaged foods and beverages. °? To reduce how much meat you eat. °· Eat the recommended amount of calcium for your age and gender. Ask your health care provider how much calcium you should have. °General instructions °· Get plenty of rest. °· Most people can resume normal activities 1-2 days after the procedure. Ask your health care provider what activities are safe for you. °· If directed, strain all urine through the strainer that was provided by your health care provider. °? Keep all fragments for your health care provider to see. Any stones that are found may be sent to a medical lab for examination. The stone may be as small as a grain of salt. °· Keep all follow-up visits as told by your health care provider. This is important. °Contact a health care provider if: °· You have pain that is severe or does not get better with medicine. °· You have nausea that is severe or does not go away. °· You have blood in your urine longer than your health care provider told you to expect. °· You have more blood in your urine. °· You have pain during urination that does not go away. °· You urinate more frequently than usual and this does not go away. °· You develop a rash or any other possible signs of an allergic reaction. °Get help right away if: °· You have severe pain in your back, sides, or upper abdomen. °· You have severe pain while urinating. °· Your urine is very dark red. °· You have blood in your stool (feces). °· You cannot pass any urine at all. °· You feel a strong urge to urinate after emptying your bladder. °· You have a fever or chills. °· You develop shortness of breath, difficulty breathing, or chest pain. °· You have severe nausea that leads to persistent vomiting. °· You  faint. °Summary °· After this procedure, it is common to have some pain, discomfort, or nausea when pieces (fragments) of the kidney stone move through the tube that carries urine from the kidney to the bladder (ureter). If this pain or nausea is severe, however, you should contact your health care provider. °· Most people can resume normal activities 1-2 days after the procedure. Ask your health care provider what activities are safe for you. °· Drink enough water and fluids to keep your urine clear or pale yellow. This helps any remaining pieces of the stone to pass, and it can help prevent new stones from forming. °· If directed, strain your urine and keep all fragments for your health care provider to see. Fragments or stones may be as small as a grain of salt. °· Get help right away if you have severe pain in your back, sides, or upper abdomen or have severe pain while urinating. °This information is not intended to replace advice given to you by your health care provider. Make sure you discuss any questions you have with your health care provider. °Document Released: 04/25/2007 Document Revised:   02/25/2016 Document Reviewed: 02/25/2016 °Elsevier Interactive Patient Education © 2018 [employer] ° °

## 2017-11-24 NOTE — Interval H&P Note (Signed)
History and Physical Interval Note:  No change in stone position.   11/24/2017 7:38 AM  Angela FittingSandra W Santos  has presented today for surgery, with the diagnosis of LEFT RENAL STONE  The various methods of treatment have been discussed with the patient and family. After consideration of risks, benefits and other options for treatment, the patient has consented to  Procedure(s): LEFT EXTRACORPOREAL SHOCK WAVE LITHOTRIPSY (ESWL) (Left) as a surgical intervention .  The patient's history has been reviewed, patient examined, no change in status, stable for surgery.  I have reviewed the patient's chart and labs.  Questions were answered to the patient's satisfaction.     Bjorn PippinJohn Kimela Malstrom

## 2017-12-13 DIAGNOSIS — N200 Calculus of kidney: Secondary | ICD-10-CM | POA: Diagnosis not present

## 2018-01-16 DIAGNOSIS — N200 Calculus of kidney: Secondary | ICD-10-CM | POA: Diagnosis not present

## 2018-02-06 ENCOUNTER — Encounter (HOSPITAL_COMMUNITY): Payer: Self-pay | Admitting: Urology

## 2018-04-17 DIAGNOSIS — N200 Calculus of kidney: Secondary | ICD-10-CM | POA: Diagnosis not present

## 2018-04-17 DIAGNOSIS — R3121 Asymptomatic microscopic hematuria: Secondary | ICD-10-CM | POA: Diagnosis not present

## 2018-04-17 DIAGNOSIS — N130 Hydronephrosis with ureteropelvic junction obstruction: Secondary | ICD-10-CM | POA: Diagnosis not present

## 2018-06-16 DIAGNOSIS — J0100 Acute maxillary sinusitis, unspecified: Secondary | ICD-10-CM | POA: Diagnosis not present

## 2019-04-12 IMAGING — DX DG ABDOMEN 1V
1 series · 1 of 1 positions shown · non-contrast
Comparison: 10/17/2017 radiograph and 05/19/2017 CT

CLINICAL DATA: Pre lithotripsy LEFT urinary calculus.

EXAM:
ABDOMEN - 1 VIEW

[abdomen kub]
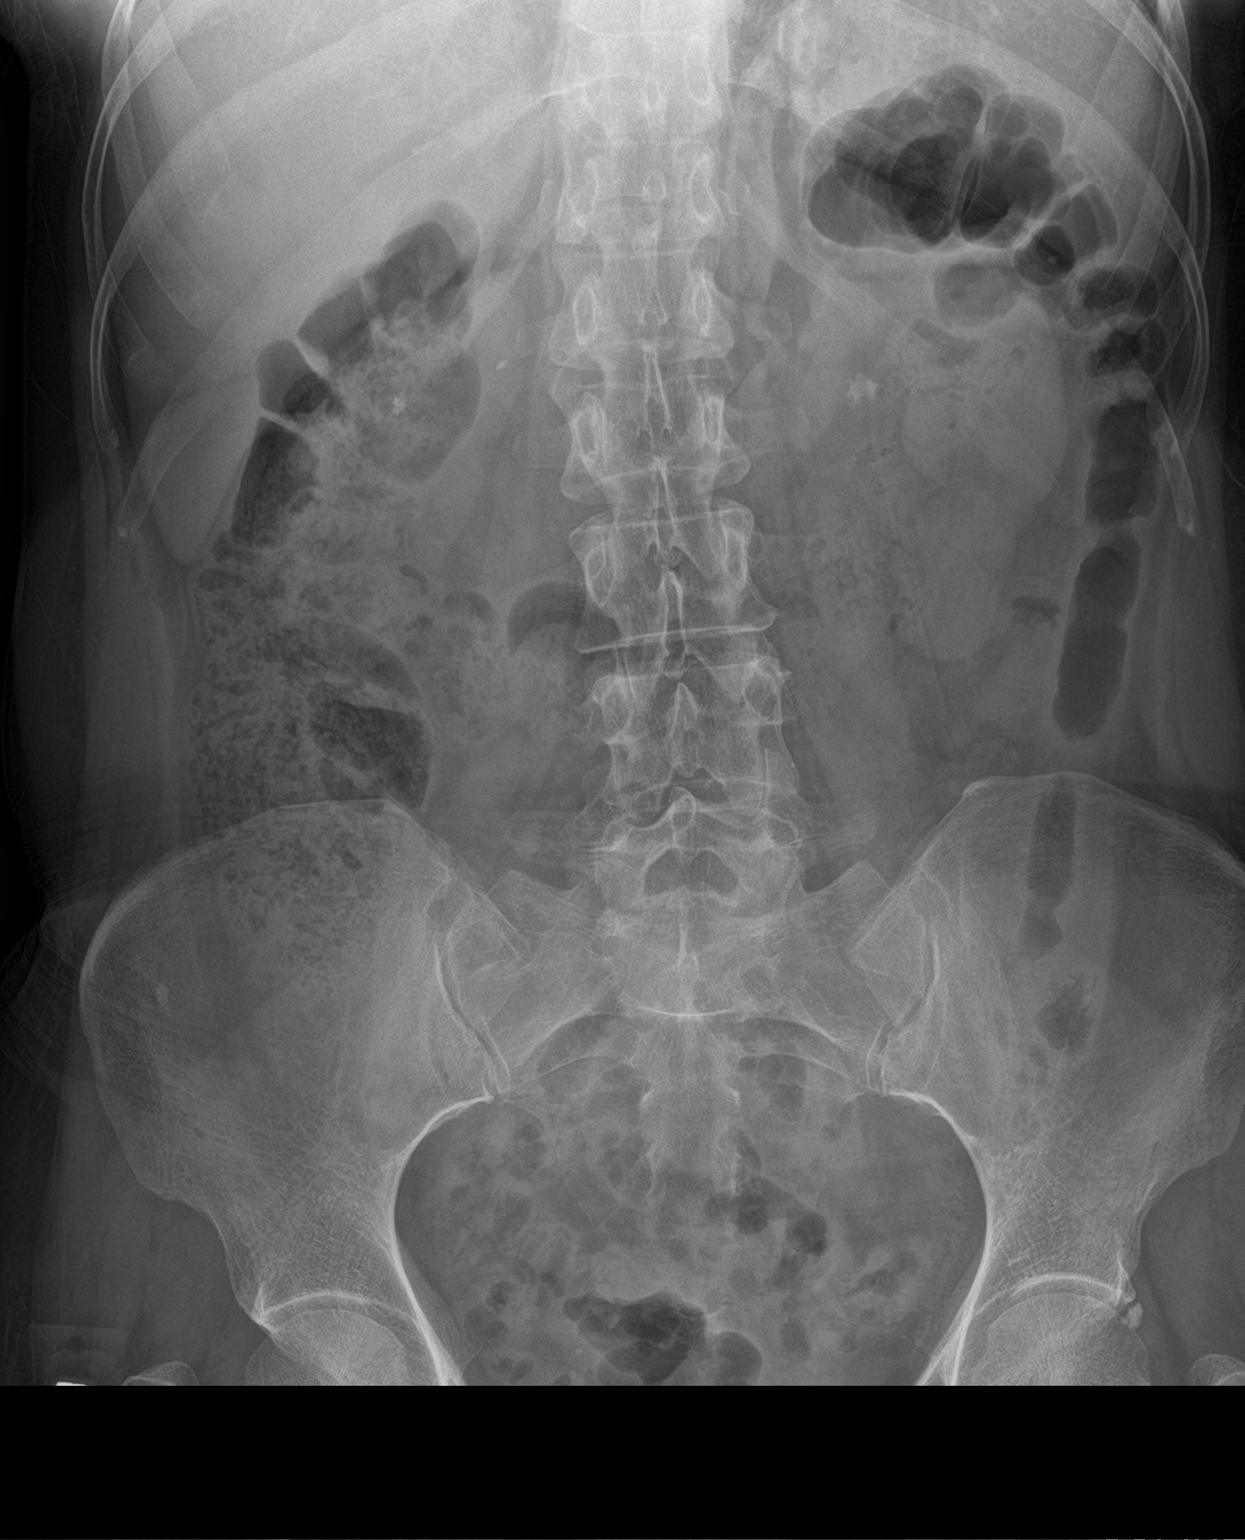

[1 of 1 positions shown; findings below may reference images not displayed]

FINDINGS: A 9 mm mid-upper LEFT renal calculus and 7 mm mid RIGHT renal
calculus are again identified.

No suspicious calcifications are noted overlying the courses of the
ureters.

The bowel gas pattern is unremarkable.
IMPRESSION: 9 mm LEFT and 7 mm RIGHT renal calculi again identified.

## 2019-08-15 ENCOUNTER — Other Ambulatory Visit: Payer: Self-pay | Admitting: Family Medicine

## 2019-08-15 DIAGNOSIS — Z1231 Encounter for screening mammogram for malignant neoplasm of breast: Secondary | ICD-10-CM

## 2019-08-24 ENCOUNTER — Ambulatory Visit
Admission: RE | Admit: 2019-08-24 | Discharge: 2019-08-24 | Disposition: A | Discharge status: Home / Self Care | Payer: 59 | Source: Ambulatory Visit | Attending: Family Medicine | Admitting: Family Medicine

## 2019-08-24 ENCOUNTER — Other Ambulatory Visit: Payer: Self-pay

## 2019-08-24 DIAGNOSIS — Z1231 Encounter for screening mammogram for malignant neoplasm of breast: Secondary | ICD-10-CM

## 2019-08-27 ENCOUNTER — Other Ambulatory Visit: Payer: Self-pay | Admitting: Family Medicine

## 2019-08-27 DIAGNOSIS — R928 Other abnormal and inconclusive findings on diagnostic imaging of breast: Secondary | ICD-10-CM

## 2019-08-30 ENCOUNTER — Other Ambulatory Visit: Payer: Self-pay | Admitting: Family Medicine

## 2019-08-30 DIAGNOSIS — R928 Other abnormal and inconclusive findings on diagnostic imaging of breast: Secondary | ICD-10-CM

## 2019-09-03 ENCOUNTER — Other Ambulatory Visit: Payer: Self-pay

## 2019-09-03 ENCOUNTER — Ambulatory Visit
Admission: RE | Admit: 2019-09-03 | Discharge: 2019-09-03 | Disposition: A | Discharge status: Home / Self Care | Payer: 59 | Source: Ambulatory Visit | Attending: Family Medicine | Admitting: Family Medicine

## 2019-09-03 ENCOUNTER — Ambulatory Visit: Payer: 59

## 2019-09-03 DIAGNOSIS — R928 Other abnormal and inconclusive findings on diagnostic imaging of breast: Secondary | ICD-10-CM

## 2021-01-19 IMAGING — MG MM DIGITAL DIAGNOSTIC UNILAT*R* W/ TOMO W/ CAD
4 series · 4 of 12 positions shown · non-contrast
Comparison: August 24, 2019 and earlier priors

CLINICAL DATA: 54-year-old patient recalled from recent screening
mammogram for evaluation a possible asymmetry in the right breast
identified only in the MLO projection.

EXAM:
DIGITAL DIAGNOSTIC UNILATERAL RIGHT MAMMOGRAM WITH CAD AND TOMO

[R MLO synth-2D]
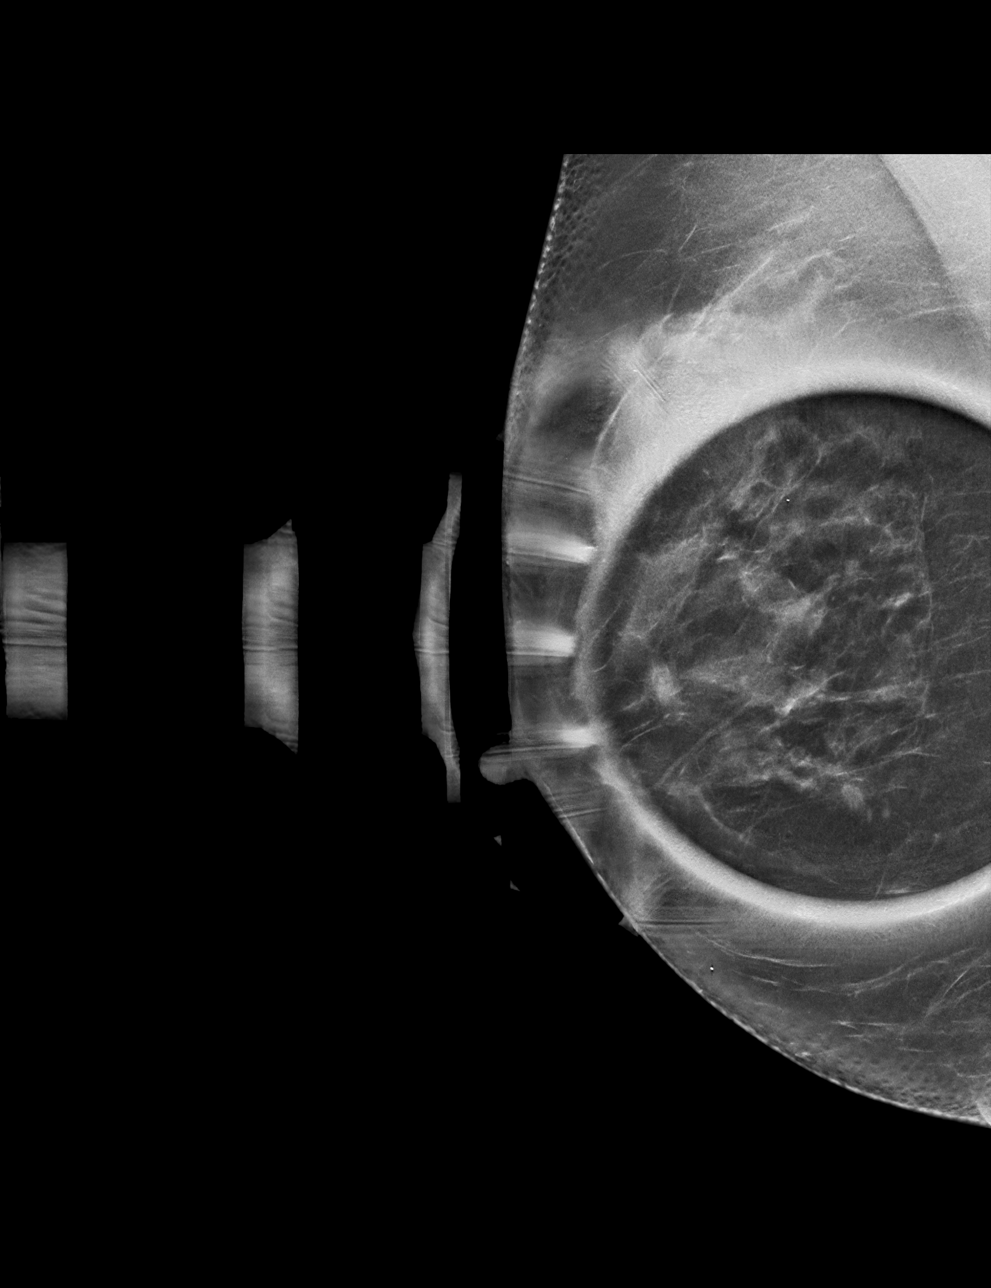

[R ML synth-2D]
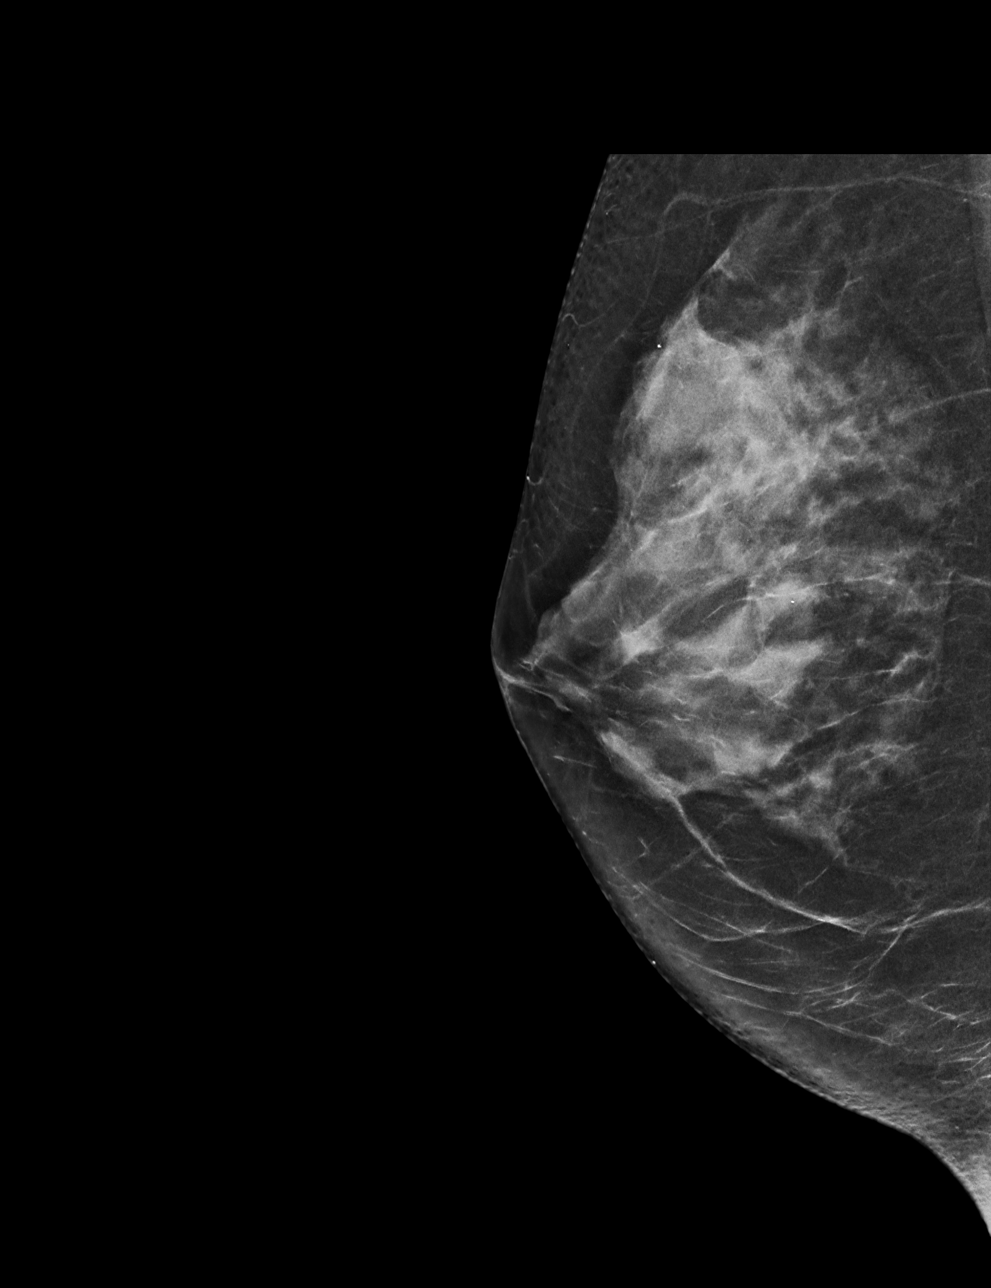

[R ML tomo · tomo slice 34/67.0]
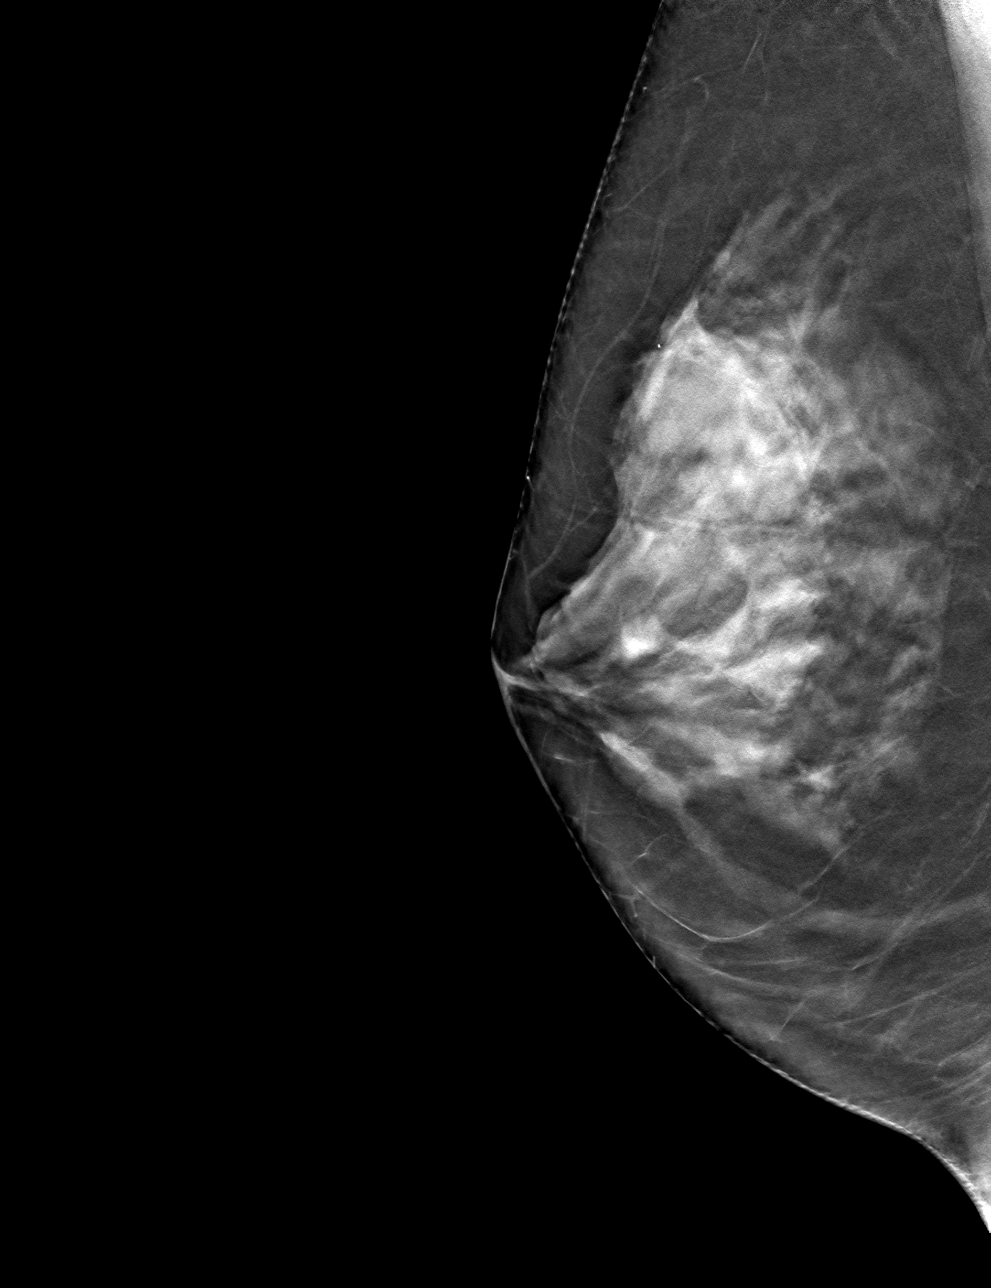

[R MLO tomo · tomo slice 32/63.0]
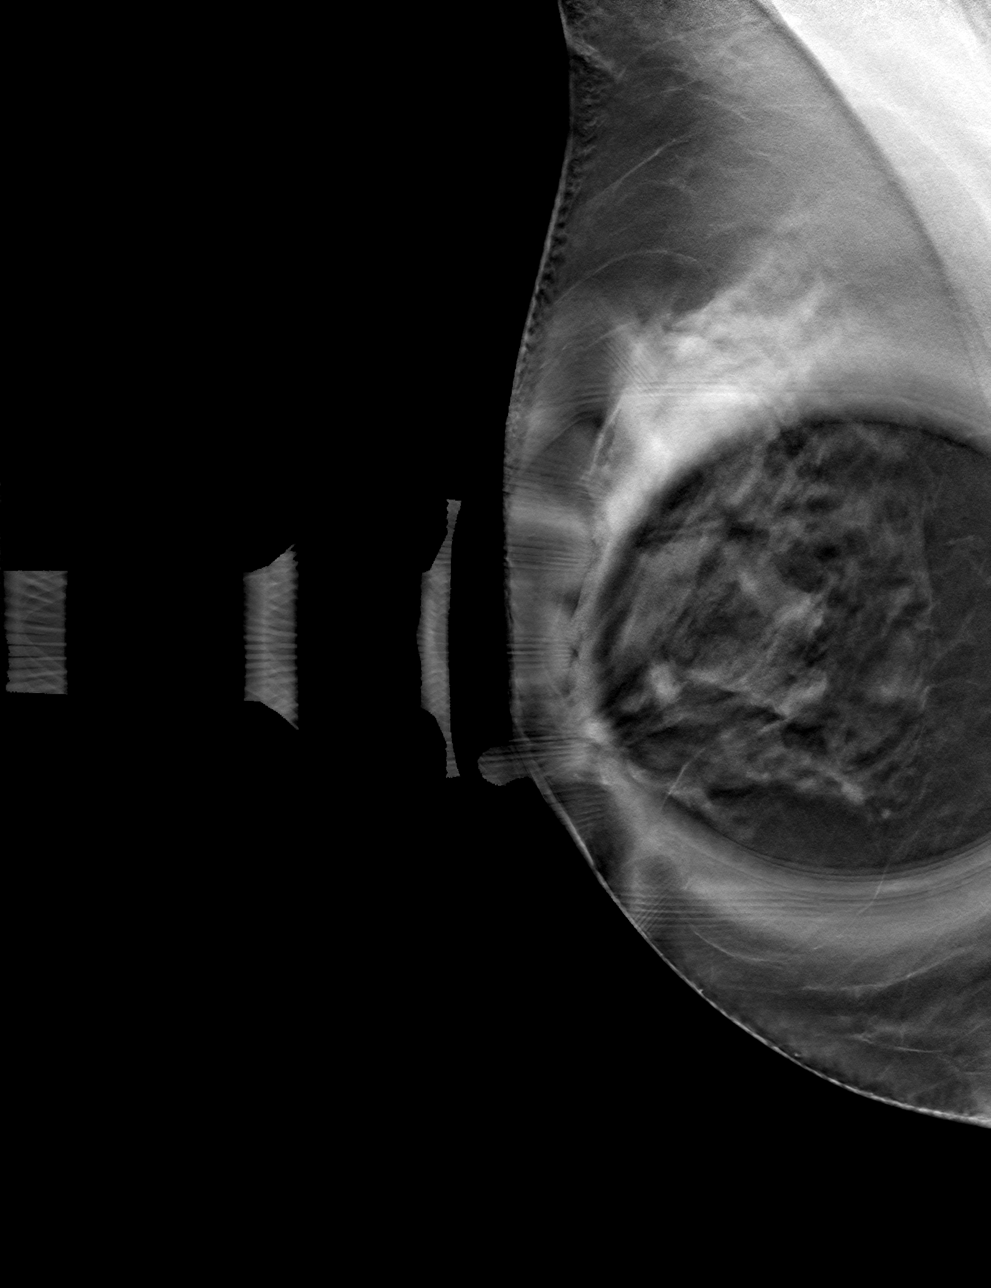

[4 of 12 positions shown; findings below may reference images not displayed]

ACR Breast Density Category c: The breast tissue is heterogeneously
dense, which may obscure small masses.
FINDINGS: Spot compression view of right breast in the MLO projection shows no
persistent asymmetry or mass. 90 degree lateral view of the right
breast is negative.

Mammographic images were processed with CAD.
IMPRESSION: No evidence of malignancy in the right breast.

RECOMMENDATION:
Screening mammogram in one year.(Code:[id number])

I have discussed the findings and recommendations with the patient.
If applicable, a reminder letter will be sent to the patient
regarding the next appointment.

BI-RADS CATEGORY  1: Negative.

## 2022-08-31 ENCOUNTER — Other Ambulatory Visit: Payer: Self-pay | Admitting: Urology

## 2022-09-01 ENCOUNTER — Other Ambulatory Visit: Payer: Self-pay

## 2022-09-01 ENCOUNTER — Encounter (HOSPITAL_BASED_OUTPATIENT_CLINIC_OR_DEPARTMENT_OTHER): Payer: Self-pay | Admitting: Urology

## 2022-09-01 NOTE — Progress Notes (Signed)
Pre procedure lithotripsy instruction given to patient.  She will refrain from smoking and will be NPO after midnight.  Her husband will be her driver and caregiver.

## 2022-09-03 ENCOUNTER — Other Ambulatory Visit: Payer: Self-pay

## 2022-09-03 ENCOUNTER — Encounter (HOSPITAL_BASED_OUTPATIENT_CLINIC_OR_DEPARTMENT_OTHER): Payer: Self-pay | Admitting: Urology

## 2022-09-03 ENCOUNTER — Ambulatory Visit (HOSPITAL_BASED_OUTPATIENT_CLINIC_OR_DEPARTMENT_OTHER)
Admission: RE | Admit: 2022-09-03 | Discharge: 2022-09-03 | Disposition: A | Discharge status: Home / Self Care | Payer: 59 | Attending: Urology | Admitting: Urology

## 2022-09-03 ENCOUNTER — Encounter (HOSPITAL_BASED_OUTPATIENT_CLINIC_OR_DEPARTMENT_OTHER)
Admission: RE | Disposition: A | Discharge status: Home / Self Care | Payer: Self-pay | Source: Home / Self Care | Attending: Urology

## 2022-09-03 ENCOUNTER — Ambulatory Visit (HOSPITAL_COMMUNITY): Payer: 59

## 2022-09-03 DIAGNOSIS — Z87440 Personal history of urinary (tract) infections: Secondary | ICD-10-CM | POA: Insufficient documentation

## 2022-09-03 DIAGNOSIS — N200 Calculus of kidney: Secondary | ICD-10-CM

## 2022-09-03 DIAGNOSIS — N202 Calculus of kidney with calculus of ureter: Secondary | ICD-10-CM | POA: Insufficient documentation

## 2022-09-03 DIAGNOSIS — Z841 Family history of disorders of kidney and ureter: Secondary | ICD-10-CM | POA: Insufficient documentation

## 2022-09-03 DIAGNOSIS — F17200 Nicotine dependence, unspecified, uncomplicated: Secondary | ICD-10-CM | POA: Insufficient documentation

## 2022-09-03 HISTORY — PX: EXTRACORPOREAL SHOCK WAVE LITHOTRIPSY: SHX1557

## 2022-09-03 SURGERY — LITHOTRIPSY, ESWL
Anesthesia: LOCAL | Laterality: Left

## 2022-09-03 MED ORDER — CIPROFLOXACIN HCL 500 MG PO TABS
ORAL_TABLET | ORAL | Status: AC
  Filled 2022-09-03: qty 1

## 2022-09-03 MED ORDER — DIAZEPAM 5 MG PO TABS
10.0000 mg | ORAL_TABLET | ORAL | Status: AC
  Administered 2022-09-03: 10 mg via ORAL

## 2022-09-03 MED ORDER — DIPHENHYDRAMINE HCL 25 MG PO CAPS
25.0000 mg | ORAL_CAPSULE | ORAL | Status: AC
  Administered 2022-09-03: 25 mg via ORAL

## 2022-09-03 MED ORDER — CIPROFLOXACIN HCL 500 MG PO TABS
500.0000 mg | ORAL_TABLET | ORAL | Status: AC
  Administered 2022-09-03: 500 mg via ORAL

## 2022-09-03 MED ORDER — TAMSULOSIN HCL 0.4 MG PO CAPS: 0.4000 mg | ORAL_CAPSULE | Freq: Every day | ORAL | 0 refills | Status: AC

## 2022-09-03 MED ORDER — HYDROCODONE-ACETAMINOPHEN 7.5-325 MG PO TABS: 1.0000 | ORAL_TABLET | Freq: Four times a day (QID) | ORAL | 0 refills | Status: DC | PRN

## 2022-09-03 MED ORDER — DIPHENHYDRAMINE HCL 25 MG PO CAPS
ORAL_CAPSULE | ORAL | Status: AC
  Filled 2022-09-03: qty 1

## 2022-09-03 MED ORDER — DIAZEPAM 5 MG PO TABS
ORAL_TABLET | ORAL | Status: AC
  Filled 2022-09-03: qty 2

## 2022-09-03 MED ORDER — SODIUM CHLORIDE 0.9 % IV SOLN
INTRAVENOUS | Status: DC
  Administered 2022-09-03: 1000 mL via INTRAVENOUS

## 2022-09-03 NOTE — Discharge Instructions (Addendum)
See [facility] discharge instructions in chart.   Post Anesthesia Home Care Instructions  Activity: Get plenty of rest for the remainder of the day. A responsible individual must stay with you for 24 hours following the procedure.  For the next 24 hours, DO NOT: -Drive a car -Operate machinery -Drink alcoholic beverages -Take any medication unless instructed by your physician -Make any legal decisions or sign important papers.  Meals: Start with liquid foods such as gelatin or soup. Progress to regular foods as tolerated. Avoid greasy, spicy, heavy foods. If nausea and/or vomiting occur, drink only clear liquids until the nausea and/or vomiting subsides. Call your physician if vomiting continues.  Special Instructions/Symptoms: Your throat may feel dry or sore from the anesthesia or the breathing tube placed in your throat during surgery. If this causes discomfort, gargle with warm salt water. The discomfort should disappear within 24 hours.       

## 2022-09-03 NOTE — H&P (Signed)
Angela Santos is a 57 year old female who is a patient of Dr. Annabell Howells who is seen on call today with complaints of left-sided flank pain.   #1. Urolithiasis:  -She has a history of a right PCNL in 2019 and a left ESWL in 11/2017. She also has a history of right UPJ stricture and was treating 02/2014 with ureteroscopic stone extraction and stenting.  -She has had several visits to our office over the past couple of months with complaints of intermittent bilateral pain. CT imaging has been advised however she has declined evaluation until now.  -KUB 06/2022 with 11 x 9 mm stone in the mid upper pole on the left and 8.4 x 9.5 mm stone into the lower pole of the right. There were no obvious ureteral stones at that time.  -Patient states that over the past several weeks, she has had worsening left-sided flank pain. She is also noted low-grade fevers and states that her fever was around 100 this morning. She is seen in the office today and is afebrile in the office. She denies nausea or emesis. At present, her pain is controlled.  -Urinalysis today with cloudy urine, microscopic hematuria, negative nitrites and 3+ leukocyte esterase.  -CT 08/12/2022: (My read) approximately 8 x 8 mm left proximal ureteral stone with moderate left hydronephrosis. Also present are punctate left renal stones. Also present is approximately 4 mm right upper pole stone and about 1 cm right lower pole stone. No stone seen in expected course of right ureter.   08/27/22: Angela Santos returns today. She has a small left distal stone and a 11x72mm left proximal stone with obstruction in addition to the right renal stones. She was given Cefdinir for a UTI but had Enterococcus and I am not sure that it is sens to Cefdinir. Her UA still looks infected today. She has no fever but has urgency. She has some flank pain.     ALLERGIES: Penicillins - Skin Rash    MEDICATIONS: Tamsulosin Hcl 0.4 mg capsule 1 capsule PO Daily  Hydrocodone-Acetaminophen 5  mg-325 mg tablet 1 tablet PO Q 6 H PRN     GU PSH: Cysto Uretero Lithotripsy - 2015 Cysto/uretero W/up Stricture - 2015 Cystoscopy Insert Stent - 2015, 2015, 2012 Endoscopy via Nephrost Tube, Right - 2019 ESWL, Left - 2019, 2015, 2012, 2010, 2009 Percut Stone Removal >2cm, Right - 2019 Ureteroscopic stone removal - 2015     NON-GU PSH: None   GU PMH: Flank Pain (Stable) - 08/12/2022, - 06/22/2022, She is having intermittent pain so I have given pain and nausea meds. , - 2019 Ureteral calculus (Stable) - 08/12/2022, Calculus of left ureter, - 2015, Calculus of right ureter, - 2015 Gross hematuria - 06/22/2022, - 2021, - 2021, - 2019 Renal calculus - 06/22/2022, - 2022, - 2021 (Stable), She has stable right renal stones but no ureteral stones. KUB in 6 months. , - 2019, - 2019, - 2019 (Stable), She has stable bilateral upper pole stones. I will get a KUB in a year. , - 2019 (Worsening), Right, She has a 2 x 3cm right mid renal stone that is probably pure CaOx monohydrate. I am going to get a CT urogram to better clarify her anatomy and will get her set up for PCNL. I reviewed the risks of bleeding, infection, injury to the kidney and adjacent structures, renal loss, need for secondary procedures, urine leaks, thrombotic events and anesthetic complications. , - 2019, Nephrolithiasis, - 2016 Hydronephrosis (Stable), She has mild right  renal fullness that is stable. Repeat RUS in 6 months. - 2019 Microscopic hematuria, She has chronic microhematuria. - 2019 Ureteral stricture, Ureteral stricture - 2016 Other microscopic hematuria, Microscopic hematuria - 2015 Personal Hx Urinary Tract Infections, History of urinary tract infection - 2015 History of urolithiasis, Nephrolithiasis - 2014 Personal Hx Oth Urinary System diseases, History of hematuria - 2014      PMH Notes:  2010-11-10 11:46:45 - Note: Nephrolithiasis Of The Left Kidney   NON-GU PMH: Encounter for general adult medical examination  without abnormal findings, Encounter for preventive health examination - 2016    FAMILY HISTORY: 1 son - Son Acute Myocardial Infarction - Father Death In The Family Father - Father Heart Disease - Mother Hematuria - Mother nephrolithiasis - Mother, Father   SOCIAL HISTORY: Marital Status: Married Preferred Language: English; Ethnicity: Not Hispanic Or Latino; Race: White Current Smoking Status: Patient smokes.   Tobacco Use Assessment Completed: Used Tobacco in last 30 days? Has never drank.  Drinks 2 caffeinated drinks per day.    REVIEW OF SYSTEMS:    GU Review Female:   Patient reports get up at night to urinate. Patient denies frequent urination, hard to postpone urination, leakage of urine, stream starts and stops, trouble starting your stream, have to strain to urinate, and being pregnant.  Gastrointestinal (Upper):   Patient denies nausea, vomiting, and indigestion/ heartburn.  Gastrointestinal (Lower):   Patient denies diarrhea and constipation.  Constitutional:   Patient denies fever, night sweats, weight loss, and fatigue.  Skin:   Patient denies skin rash/ lesion and itching.  Eyes:   Patient denies blurred vision and double vision.  Ears/ Nose/ Throat:   Patient denies sore throat and sinus problems.  Hematologic/Lymphatic:   Patient denies swollen glands and easy bruising.  Cardiovascular:   Patient denies leg swelling and chest pains.  Respiratory:   Patient denies cough and shortness of breath.  Endocrine:   Patient denies excessive thirst.  Musculoskeletal:   Patient denies back pain and joint pain.  Neurological:   Patient denies headaches and dizziness.  Psychologic:   Patient denies depression and anxiety.   VITAL SIGNS: None   MULTI-SYSTEM PHYSICAL EXAMINATION:    Constitutional: Well-nourished. No physical deformities. Normally developed. Good grooming.  Respiratory: Normal breath sounds. No labored breathing, no use of accessory muscles.    Cardiovascular: Regular rate and rhythm. No murmur, no gallop.      Complexity of Data:  Records Review:   Previous Patient Records  Urine Test Review:   Urinalysis, Urine Culture  X-Ray Review: KUB: Reviewed Films. Discussed With Patient.  Renal Ultrasound: Reviewed Films. Discussed With Patient.     PROCEDURES:         KUB - [id number]  A single view of the abdomen is obtained. there is an 11mm left proximal and 4mm left distal stone and 2 right renal stones with a 10mm RLP stone. She has no significant bone, gas or soft tissue abnormalities.       . Patient confirmed No Neulasta OnPro Device.            Renal Ultrasound - [id number]  Right Kidney: Length: 10.7 cm Depth: 4.2 cm Cortical Width: 1.5cm Width: 4.9 cm  Left Kidney: Length: 11.1cm Depth: 6.6 cm Cortical Width: 1.3 cm Width: 5.7cm  Left Kidney/Ureter:  Hydro is noted today. There is a stone in the proximal ureter measuring 1.6cm and a cyst in the lower pole measuring 3.3cm  Right Kidney/Ureter:  multiple stones  noted   Bladder:  PVR= 15.73ml      . Patient confirmed No Neulasta OnPro Device.           Visit Complexity - G2211 Chronic management         Urinalysis w/Scope Dipstick Dipstick Cont'd Micro  Color: Yellow Bilirubin: Neg mg/dL WBC/hpf: >16/XWR  Appearance: Cloudy Ketones: Neg mg/dL RBC/hpf: 3 - 60/AVW  Specific Gravity: 1.025 Blood: 3+ ery/uL Bacteria: Mod (26-50/hpf)  pH: 6.0 Protein: 2+ mg/dL Cystals: NS (Not Seen)  Glucose: Neg mg/dL Urobilinogen: 0.2 mg/dL Casts: NS (Not Seen)    Nitrites: Neg Trichomonas: Not Present    Leukocyte Esterase: 3+ leu/uL Mucous: Not Present      Epithelial Cells: 0 - 5/hpf      Yeast: NS (Not Seen)      Sperm: Not Present    ASSESSMENT:      ICD-10 Details  1 GU:   Hydronephrosis - N13.0 Chronic, Stable - She has an 11mm left UPJ stone and 4mm left distal stone with obstruction and cystitis without systemic symptoms. I have recommended stent with subsequent URS or  ESWL but she doesn't want the stent.   I will get her recultured and on an appropriate antibiotic and then shoot for ESWL of both stones on Friday of next week. She will go the [facility] ER if she gets a fever.   I reviewed the risks of ESWL including bleeding, infection, injury to the kidney or adjacent structures, failure to fragment the stone, need for ancillary procedures, thrombotic events, cardiac arrhythmias and sedation complications.     2   Ureteral calculus - N20.1 Chronic, Stable  3   Renal calculus - N20.0 Chronic, Stable - She has right renal stones and will probably need those treated at some point.   4   Personal Hx Urinary Tract Infections - Z87.440 Chronic, Stable  5 NON-GU:   Bacteriuria - R82.71 Chronic, Stable   PLAN:           Orders Labs Urine Culture          Schedule Return Visit/Planned Activity: Next Available Appointment - Schedule Surgery  Procedure: 09/03/2022 - ESWL - [id number], left Notes: left distal stone and left UPJ stone.

## 2022-09-03 NOTE — Op Note (Signed)
See [employer] OP note scanned into chart. Also because of the size, density, location and other factors that cannot be anticipated I feel this will likely be a staged procedure. This fact supersedes any indication in the scanned [place] [facility] operative note to the contrary.   Treated both distal ureteral stone (2500 shocks)  and proximal ureteral stone (4000 shocks).

## 2022-09-06 ENCOUNTER — Encounter (HOSPITAL_BASED_OUTPATIENT_CLINIC_OR_DEPARTMENT_OTHER): Payer: Self-pay | Admitting: Urology

## 2022-09-27 ENCOUNTER — Other Ambulatory Visit: Payer: Self-pay | Admitting: Urology

## 2022-09-28 ENCOUNTER — Other Ambulatory Visit: Payer: Self-pay | Admitting: Urology

## 2022-10-06 ENCOUNTER — Encounter (HOSPITAL_BASED_OUTPATIENT_CLINIC_OR_DEPARTMENT_OTHER): Payer: Self-pay | Admitting: Urology

## 2022-10-06 NOTE — Progress Notes (Signed)
Spoke w/ via phone for pre-op interview--- Angela Santos Lab needs dos---- ISTAT(GENT)              Lab results------ COVID test -----patient states asymptomatic no test needed Arrive at -------0730 NPO after MN NO Solid Food.   Med rec completed Medications to take morning of surgery -----Norco and Zofran PRN  Diabetic medication ----- Patient instructed no nail polish to be worn day of surgery Patient instructed to bring photo id and insurance card day of surgery Patient aware to have Driver (ride ) / caregiver  Husband Angela Santos  for 24 hours after surgery  Patient Special Instructions ----- Pre-Op special Instructions ----- Patient verbalized understanding of instructions that were given at this phone interview. Patient denies shortness of breath, chest pain, fever, cough at this phone interview.

## 2022-10-27 NOTE — H&P (Signed)
Angela Santos is a 57 year old female who is a patient of Dr. Annabell Howells who is seen on call today with complaints of left-sided flank pain.   #1. Urolithiasis:  -She has a history of a right PCNL in 2019 and a left ESWL in 11/2017. She also has a history of right UPJ stricture and was treating 02/2014 with ureteroscopic stone extraction and stenting.  -She has had several visits to our office over the past couple of months with complaints of intermittent bilateral pain. CT imaging has been advised however she has declined evaluation until now.  -KUB 06/2022 with 11 x 9 mm stone in the mid upper pole on the left and 8.4 x 9.5 mm stone into the lower pole of the right. There were no obvious ureteral stones at that time.  -Patient states that over the past several weeks, she has had worsening left-sided flank pain. She is also noted low-grade fevers and states that her fever was around 100 this morning. She is seen in the office today and is afebrile in the office. She denies nausea or emesis. At present, her pain is controlled.  -Urinalysis today with cloudy urine, microscopic hematuria, negative nitrites and 3+ leukocyte esterase.  -CT 08/12/2022: (My read) approximately 8 x 8 mm left proximal ureteral stone with moderate left hydronephrosis. Also present are punctate left renal stones. Also present is approximately 4 mm right upper pole stone and about 1 cm right lower pole stone. No stone seen in expected course of right ureter.   08/27/22: Angela Santos returns today. She has a small left distal stone and a 11x54mm left proximal stone with obstruction in addition to the right renal stones. She was given Cefdinir for a UTI but had Enterococcus and I am not sure that it is sens to Cefdinir. Her UA still looks infected today. She has no fever but has urgency. She has some flank pain.   09/17/2022:  Patient presents with her husband at her side for 2-week follow-up status post ESWL for 2 left ureteral calculi, 4 mm distally,  11 mm at UPJ. She continues on tamsulosin. Patient states she tolerated procedure well. Since last seen, she has passed stone material, which he brings in with her. Since then, patient endorses resolution of prior stone pain. She is almost back to baseline voiding, function, however, she endorses increased nocturia, intermittent nausea, and some frequency during the day. Today, she denies irritative symptoms, gross hematuria, fever/chill, vomiting.   10/01/2022:  Patient presents for 2-week follow-up on UTI. She has completed her course of doxycycline, and is on no other medications. Since last seen, patient endorses improvement of voiding function after doxycycline. However, last weekend, patient experienced right kidney pain, reminiscent of stone pain. Pain was severe on Sunday, improved Monday, and recurred Tuesday, with associated dysuria. Patient denies gross hematuria, and is at baseline voiding function, with the exception of mildly increased frequency. Today, patient presents at baseline, without pain. She further denies fever/chills, nausea/vomiting. She would like to rule out UTI, which would confirm to her that she is passing another stone.     ALLERGIES: Penicillins - Skin Rash    MEDICATIONS: Hydrocodone-Acetaminophen 5 mg-325 mg tablet 1 tablet PO Q 6 H PRN     GU PSH: Cysto Uretero Lithotripsy - 2015 Cysto/uretero W/up Stricture - 2015 Cystoscopy Insert Stent - 2015, 2015, 2012 Endoscopy via Nephrost Tube, Right - 2019 ESWL, Left - 2019, 2015, 2012, 2010, 2009 Percut Stone Removal >2cm, Right - 2019 Ureteroscopic stone removal -  2015     NON-GU PSH: Visit Complexity (formerly GPC1X) - 08/27/2022     GU PMH: Acute Cystitis/UTI - 09/17/2022 History of urolithiasis - 09/17/2022, Nephrolithiasis, - 2014 Renal calculus - 09/17/2022, She has right renal stones and will probably need those treated at some point. , - 08/27/2022, - 06/22/2022, - 2022, - 2021 (Stable), She has stable right  renal stones but no ureteral stones. KUB in 6 months. , - 2019, - 2019, - 2019 (Stable), She has stable bilateral upper pole stones. I will get a KUB in a year. , - 2019 (Worsening), Right, She has a 2 x 3cm right mid renal stone that is probably pure CaOx monohydrate. I am going to get a CT urogram to better clarify her anatomy and will get her set up for PCNL. I reviewed the risks of bleeding, infection, injury to the kidney and adjacent structures, renal loss, need for secondary procedures, urine leaks, thrombotic events and anesthetic complications. , - 2019, Nephrolithiasis, - 2016 Ureteral calculus - 09/17/2022, - 08/27/2022 (Stable), - 08/12/2022, Calculus of left ureter, - 2015, Calculus of right ureter, - 2015 Hydronephrosis, She has an 11mm left UPJ stone and 4mm left distal stone with obstruction and cystitis without systemic symptoms. I have recommended stent with subsequent URS or ESWL but she doesn't want the stent. I will get her recultured and on an appropriate antibiotic and then shoot for ESWL of both stones on Friday of next week. She will go [facility] if she gets a fever. I reviewed the risks of ESWL including bleeding, infection, injury to the kidney or adjacent structures, failure to fragment the stone, need for ancillary procedures, thrombotic events, cardiac arrhythmias and sedation complications. - 08/27/2022, (Stable), She has mild right renal fullness that is stable. Repeat RUS in 6 months. , - 2019 Personal Hx Urinary Tract Infections - 08/27/2022, History of urinary tract infection, - 2015 Flank Pain (Stable) - 08/12/2022, - 06/22/2022, She is having intermittent pain so I have given pain and nausea meds. , - 2019 Gross hematuria - 06/22/2022, - 2021, - 2021, - 2019 Microscopic hematuria, She has chronic microhematuria. - 2019 Ureteral stricture, Ureteral stricture - 2016 Other microscopic hematuria, Microscopic hematuria - 2015 Personal Hx Oth Urinary System diseases, History of  hematuria - 2014      PMH Notes:  2010-11-10 11:46:45 - Note: Nephrolithiasis Of The Left Kidney   NON-GU PMH: Bacteriuria - 08/27/2022 Encounter for general adult medical examination without abnormal findings, Encounter for preventive health examination - 2016    Immunizations: None   FAMILY HISTORY: 1 son - Son Acute Myocardial Infarction - Father Death In The Family Father - Father Heart Disease - Mother Hematuria - Mother nephrolithiasis - Mother, Father   SOCIAL HISTORY: Marital Status: Married Preferred Language: English; Ethnicity: Not Hispanic Or Latino; Race: White Current Smoking Status: Patient smokes.   Tobacco Use Assessment Completed: Used Tobacco in last 30 days? Has never drank.  Drinks 2 caffeinated drinks per day.    REVIEW OF SYSTEMS:    GU Review Female:   Patient reports frequent urination and burning /pain with urination. Patient denies hard to postpone urination, get up at night to urinate, leakage of urine, stream starts and stops, trouble starting your stream, have to strain to urinate, and being pregnant.  Gastrointestinal (Upper):   Patient denies nausea, vomiting, and indigestion/ heartburn.  Gastrointestinal (Lower):   Patient denies diarrhea and constipation.  Constitutional:   Patient denies fever, night sweats, weight  loss, and fatigue.  Skin:   Patient denies skin rash/ lesion and itching.  Eyes:   Patient denies blurred vision and double vision.  Ears/ Nose/ Throat:   Patient denies sore throat and sinus problems.  Hematologic/Lymphatic:   Patient denies swollen glands and easy bruising.  Cardiovascular:   Patient denies chest pains and leg swelling.  Respiratory:   Patient denies cough and shortness of breath.  Endocrine:   Patient denies excessive thirst.  Musculoskeletal:   Patient denies back pain and joint pain.  Neurological:   Patient denies headaches and dizziness.  Psychologic:   Patient denies depression and anxiety.   Notes:  Right flank pain.    VITAL SIGNS:      10/01/2022 10:10 AM  BP 121/76 mmHg  Heart Rate 65 /min  Temperature 97.0 F / 36.1 C   MULTI-SYSTEM PHYSICAL EXAMINATION:    Constitutional: Well-nourished. No physical deformities. Normally developed. Good grooming. Patient is pleasant, in no acute discomfort, distress.  Respiratory: No labored breathing, no use of accessory muscles.   Cardiovascular: Normal temperature, normal extremity pulses, no swelling.   Skin: No paleness, no jaundice, no cyanosis.   Neurologic / Psychiatric: Oriented to time, oriented to place, oriented to person. No depression, no anxiety, no agitation.  Gastrointestinal: No mass, no suprapubic nor bilateral CVA tenderness, no rigidity, non obese abdomen.      Complexity of Data:  Source Of History:  Patient, Family/Caregiver, Medical Record Summary  Records Review:   Previous Doctor Records, Previous Patient Records  Urine Test Review:   Urinalysis   10/01/22  Urinalysis  Urine Appearance Clear   Urine Color Yellow   Urine Glucose Neg mg/dL  Urine Bilirubin Neg mg/dL  Urine Ketones Neg mg/dL  Urine Specific Gravity 1.025   Urine Blood 1+ ery/uL  Urine pH 6.5   Urine Protein Neg mg/dL  Urine Urobilinogen 0.2 mg/dL  Urine Nitrites Neg   Urine Leukocyte Esterase Neg leu/uL  Urine WBC/hpf NS (Not Seen)   Urine RBC/hpf 3 - 10/hpf   Urine Epithelial Cells NS (Not Seen)   Urine Bacteria NS (Not Seen)   Urine Mucous Not Present   Urine Yeast NS (Not Seen)   Urine Trichomonas Not Present   Urine Cystals NS (Not Seen)   Urine Casts NS (Not Seen)   Urine Sperm Not Present    PROCEDURES:          Urinalysis w/Scope Dipstick Dipstick Cont'd Micro  Color: Yellow Bilirubin: Neg mg/dL WBC/hpf: NS (Not Seen)  Appearance: Clear Ketones: Neg mg/dL RBC/hpf: 3 - 65/HQI  Specific Gravity: 1.025 Blood: 1+ ery/uL Bacteria: NS (Not Seen)  pH: 6.5 Protein: Neg mg/dL Cystals: NS (Not Seen)  Glucose: Neg mg/dL Urobilinogen:  0.2 mg/dL Casts: NS (Not Seen)    Nitrites: Neg Trichomonas: Not Present    Leukocyte Esterase: Neg leu/uL Mucous: Not Present      Epithelial Cells: NS (Not Seen)      Yeast: NS (Not Seen)      Sperm: Not Present    ASSESSMENT:      ICD-10 Details  1 GU:   Acute Cystitis/UTI - N30.00 Acute, Stable, Resolved  2   Flank Pain - R10.84 Right, Undiagnosed New Problem  3   Ureteral calculus - N20.1 Right, Undiagnosed New Problem  4   Renal calculus - N20.0 Chronic, Stable   PLAN:           Orders Labs Urine Culture  Schedule Return Visit/Planned Activity: Keep Scheduled Appointment             Note: Ureteroscopy on 7/12          Document Letter(s):  Created for Patient: Clinical Summary         Notes:   Today, UA with microhematuria, without infectious parameters. It appears patient has cleared her UTI. However, she is likely experiencing passage of a small stone. Out of an abundance of caution, and with upcoming ureteroscopy in mind, we will send for precautionary culture for presurgical clearance, and follow-up with patient as appropriate.   We offered patient imaging to confirm a right-sided ureteral stone. Patient declined. She intends to restart MET. She has leftover tamsulosin, pain meds. We requested renal ultrasound, but patient declined again.   Maintain upcoming ureteroscopy appointment on 7/12. Patient knows to return sooner with worsening presentation, development of new concerns. Initiate tamsulosin, as needed NSAIDs, as needed oxycodone. Patient voiced understanding and is amenable to this plan.        Next Appointment:      Next Appointment: 10/29/2022 09:30 AM    Appointment Type: Surgery     Location: [facility] [phone]    Provider: Bjorn Pippin, M.D.    Reason for Visit: OP NE CYSTO RT RGP URS HLL STENT

## 2022-10-29 ENCOUNTER — Encounter (HOSPITAL_BASED_OUTPATIENT_CLINIC_OR_DEPARTMENT_OTHER)
Admission: RE | Disposition: A | Discharge status: Home / Self Care | Payer: Self-pay | Source: Home / Self Care | Attending: Urology

## 2022-10-29 ENCOUNTER — Encounter (HOSPITAL_BASED_OUTPATIENT_CLINIC_OR_DEPARTMENT_OTHER): Payer: Self-pay | Admitting: Urology

## 2022-10-29 ENCOUNTER — Ambulatory Visit (HOSPITAL_BASED_OUTPATIENT_CLINIC_OR_DEPARTMENT_OTHER): Payer: 59 | Admitting: Anesthesiology

## 2022-10-29 ENCOUNTER — Ambulatory Visit (HOSPITAL_BASED_OUTPATIENT_CLINIC_OR_DEPARTMENT_OTHER)
Admission: RE | Admit: 2022-10-29 | Discharge: 2022-10-29 | Disposition: A | Discharge status: Home / Self Care | Payer: 59 | Attending: Urology | Admitting: Urology

## 2022-10-29 DIAGNOSIS — N201 Calculus of ureter: Secondary | ICD-10-CM

## 2022-10-29 DIAGNOSIS — Z6835 Body mass index (BMI) 35.0-35.9, adult: Secondary | ICD-10-CM

## 2022-10-29 DIAGNOSIS — Z01818 Encounter for other preprocedural examination: Secondary | ICD-10-CM

## 2022-10-29 DIAGNOSIS — F17200 Nicotine dependence, unspecified, uncomplicated: Secondary | ICD-10-CM | POA: Diagnosis not present

## 2022-10-29 DIAGNOSIS — N200 Calculus of kidney: Secondary | ICD-10-CM | POA: Diagnosis present

## 2022-10-29 HISTORY — DX: Personal history of urinary calculi: Z87.442

## 2022-10-29 HISTORY — PX: CYSTOSCOPY/URETEROSCOPY/HOLMIUM LASER/STENT PLACEMENT: SHX6546

## 2022-10-29 LAB — POCT I-STAT, CHEM 8
BUN: 24 mg/dL — ABNORMAL HIGH (ref 6–20)
Calcium, Ion: 1.32 mmol/L (ref 1.15–1.40)
Chloride: 107 mmol/L (ref 98–111)
Creatinine, Ser: 0.8 mg/dL (ref 0.44–1.00)
Glucose, Bld: 89 mg/dL (ref 70–99)
HCT: 39 % (ref 36.0–46.0)
Hemoglobin: 13.3 g/dL (ref 12.0–15.0)
Potassium: 4 mmol/L (ref 3.5–5.1)
Sodium: 141 mmol/L (ref 135–145)
TCO2: 23 mmol/L (ref 22–32)

## 2022-10-29 SURGERY — CYSTOSCOPY/URETEROSCOPY/HOLMIUM LASER/STENT PLACEMENT
Anesthesia: General | Site: Renal | Laterality: Right

## 2022-10-29 MED ORDER — HYDROCODONE-ACETAMINOPHEN 7.5-325 MG PO TABS: 1.0000 | ORAL_TABLET | Freq: Four times a day (QID) | ORAL | 0 refills | Status: AC | PRN

## 2022-10-29 MED ORDER — DEXAMETHASONE SODIUM PHOSPHATE 10 MG/ML IJ SOLN
INTRAMUSCULAR | Status: DC | PRN
  Administered 2022-10-29: 5 mg via INTRAVENOUS

## 2022-10-29 MED ORDER — ACETAMINOPHEN 325 MG PO TABS: 325.0000 mg | ORAL_TABLET | ORAL | Status: DC | PRN

## 2022-10-29 MED ORDER — SODIUM CHLORIDE 0.9% FLUSH: 3.0000 mL | Freq: Two times a day (BID) | INTRAVENOUS | Status: DC

## 2022-10-29 MED ORDER — MIDAZOLAM HCL 5 MG/5ML IJ SOLN
INTRAMUSCULAR | Status: DC | PRN
  Administered 2022-10-29: 2 mg via INTRAVENOUS

## 2022-10-29 MED ORDER — ACETAMINOPHEN 500 MG PO TABS
1000.0000 mg | ORAL_TABLET | Freq: Once | ORAL | Status: AC
  Administered 2022-10-29: 1000 mg via ORAL

## 2022-10-29 MED ORDER — PROPOFOL 1000 MG/100ML IV EMUL
INTRAVENOUS | Status: AC
  Filled 2022-10-29: qty 100

## 2022-10-29 MED ORDER — FENTANYL CITRATE (PF) 100 MCG/2ML IJ SOLN
INTRAMUSCULAR | Status: DC | PRN
  Administered 2022-10-29 (×2): 50 ug via INTRAVENOUS

## 2022-10-29 MED ORDER — OXYCODONE HCL 5 MG PO TABS: 5.0000 mg | ORAL_TABLET | Freq: Once | ORAL | Status: DC | PRN

## 2022-10-29 MED ORDER — DEXAMETHASONE SODIUM PHOSPHATE 10 MG/ML IJ SOLN
INTRAMUSCULAR | Status: AC
  Filled 2022-10-29: qty 1

## 2022-10-29 MED ORDER — EPHEDRINE SULFATE-NACL 50-0.9 MG/10ML-% IV SOSY
PREFILLED_SYRINGE | INTRAVENOUS | Status: DC | PRN
  Administered 2022-10-29: 10 mg via INTRAVENOUS

## 2022-10-29 MED ORDER — SODIUM CHLORIDE 0.9 % IR SOLN
Status: DC | PRN
  Administered 2022-10-29: 3000 mL

## 2022-10-29 MED ORDER — EPHEDRINE 5 MG/ML INJ
INTRAVENOUS | Status: AC
  Filled 2022-10-29: qty 5

## 2022-10-29 MED ORDER — CELECOXIB 200 MG PO CAPS
200.0000 mg | ORAL_CAPSULE | Freq: Once | ORAL | Status: AC
  Administered 2022-10-29: 200 mg via ORAL

## 2022-10-29 MED ORDER — ACETAMINOPHEN 160 MG/5ML PO SOLN: 325.0000 mg | ORAL | Status: DC | PRN

## 2022-10-29 MED ORDER — SUCCINYLCHOLINE CHLORIDE 200 MG/10ML IV SOSY
PREFILLED_SYRINGE | INTRAVENOUS | Status: DC | PRN
  Administered 2022-10-29: 20 mg via INTRAVENOUS

## 2022-10-29 MED ORDER — PROPOFOL 10 MG/ML IV BOLUS
INTRAVENOUS | Status: AC
  Filled 2022-10-29: qty 20

## 2022-10-29 MED ORDER — SUCCINYLCHOLINE CHLORIDE 200 MG/10ML IV SOSY
PREFILLED_SYRINGE | INTRAVENOUS | Status: AC
  Filled 2022-10-29: qty 10

## 2022-10-29 MED ORDER — PROPOFOL 10 MG/ML IV BOLUS
INTRAVENOUS | Status: DC | PRN
  Administered 2022-10-29 (×2): 20 mg via INTRAVENOUS
  Administered 2022-10-29: 150 mg via INTRAVENOUS
  Administered 2022-10-29: 20 mg via INTRAVENOUS
  Administered 2022-10-29: 30 mg via INTRAVENOUS

## 2022-10-29 MED ORDER — 0.9 % SODIUM CHLORIDE (POUR BTL) OPTIME
TOPICAL | Status: DC | PRN
  Administered 2022-10-29: 500 mL

## 2022-10-29 MED ORDER — CELECOXIB 200 MG PO CAPS
ORAL_CAPSULE | ORAL | Status: AC
  Filled 2022-10-29: qty 1

## 2022-10-29 MED ORDER — ONDANSETRON HCL 4 MG/2ML IJ SOLN: 4.0000 mg | Freq: Once | INTRAMUSCULAR | Status: DC | PRN

## 2022-10-29 MED ORDER — FENTANYL CITRATE (PF) 100 MCG/2ML IJ SOLN
INTRAMUSCULAR | Status: AC
  Filled 2022-10-29: qty 2

## 2022-10-29 MED ORDER — MEPERIDINE HCL 25 MG/ML IJ SOLN: 6.2500 mg | INTRAMUSCULAR | Status: DC | PRN

## 2022-10-29 MED ORDER — ACETAMINOPHEN 500 MG PO TABS
ORAL_TABLET | ORAL | Status: AC
  Filled 2022-10-29: qty 2

## 2022-10-29 MED ORDER — LIDOCAINE 2% (20 MG/ML) 5 ML SYRINGE
INTRAMUSCULAR | Status: DC | PRN
  Administered 2022-10-29: 100 mg via INTRAVENOUS

## 2022-10-29 MED ORDER — ONDANSETRON HCL 4 MG/2ML IJ SOLN
INTRAMUSCULAR | Status: DC | PRN
  Administered 2022-10-29: 4 mg via INTRAVENOUS

## 2022-10-29 MED ORDER — ONDANSETRON HCL 4 MG/2ML IJ SOLN
INTRAMUSCULAR | Status: AC
  Filled 2022-10-29: qty 2

## 2022-10-29 MED ORDER — ONDANSETRON HCL 4 MG PO TABS: 4.0000 mg | ORAL_TABLET | Freq: Four times a day (QID) | ORAL | 0 refills | Status: AC | PRN

## 2022-10-29 MED ORDER — LACTATED RINGERS IV SOLN: INTRAVENOUS | Status: DC

## 2022-10-29 MED ORDER — GENTAMICIN SULFATE 40 MG/ML IJ SOLN
5.0000 mg/kg | Freq: Once | INTRAVENOUS | Status: AC
  Administered 2022-10-29: 240 mg via INTRAVENOUS
  Filled 2022-10-29: qty 6

## 2022-10-29 MED ORDER — MIDAZOLAM HCL 2 MG/2ML IJ SOLN
INTRAMUSCULAR | Status: AC
  Filled 2022-10-29: qty 2

## 2022-10-29 MED ORDER — OXYCODONE HCL 5 MG/5ML PO SOLN: 5.0000 mg | Freq: Once | ORAL | Status: DC | PRN

## 2022-10-29 MED ORDER — SCOPOLAMINE 1 MG/3DAYS TD PT72
1.0000 | MEDICATED_PATCH | TRANSDERMAL | Status: DC
  Administered 2022-10-29: 1.5 mg via TRANSDERMAL

## 2022-10-29 MED ORDER — IOHEXOL 300 MG/ML  SOLN
INTRAMUSCULAR | Status: DC | PRN
  Administered 2022-10-29: 3 mL via URETHRAL

## 2022-10-29 MED ORDER — PROPOFOL 500 MG/50ML IV EMUL
INTRAVENOUS | Status: DC | PRN
  Administered 2022-10-29: 200 ug/kg/min via INTRAVENOUS

## 2022-10-29 MED ORDER — FENTANYL CITRATE (PF) 100 MCG/2ML IJ SOLN: 25.0000 ug | INTRAMUSCULAR | Status: DC | PRN

## 2022-10-29 MED ORDER — SCOPOLAMINE 1 MG/3DAYS TD PT72
MEDICATED_PATCH | TRANSDERMAL | Status: AC
  Filled 2022-10-29: qty 1

## 2022-10-29 MED ORDER — LIDOCAINE HCL (PF) 2 % IJ SOLN
INTRAMUSCULAR | Status: AC
  Filled 2022-10-29: qty 10

## 2022-10-29 SURGICAL SUPPLY — 27 items
BAG DRAIN URO-CYSTO SKYTR STRL (DRAIN) ×1 IMPLANT
BAG DRN UROCATH (DRAIN) ×1
BASKET STONE 1.7 NGAGE (UROLOGICAL SUPPLIES) IMPLANT
BASKET ZERO TIP NITINOL 2.4FR (BASKET) IMPLANT
BSKT STON RTRVL ZERO TP 2.4FR (BASKET)
CATH URET 5FR 70CM CONE TIP (BALLOONS) IMPLANT
CATH URETL OPEN 5X70 (CATHETERS) IMPLANT
CLOTH BEACON ORANGE TIMEOUT ST (SAFETY) ×1 IMPLANT
ELECT REM PT RETURN 9FT ADLT (ELECTROSURGICAL)
ELECTRODE REM PT RTRN 9FT ADLT (ELECTROSURGICAL) IMPLANT
GLOVE SURG SS PI 8.0 STRL IVOR (GLOVE) ×1 IMPLANT
GOWN STRL REUS W/TWL XL LVL3 (GOWN DISPOSABLE) ×1 IMPLANT
GUIDEWIRE ANG ZIPWIRE 038X150 (WIRE) IMPLANT
GUIDEWIRE STR DUAL SENSOR (WIRE) ×1 IMPLANT
INFUSOR MANOMETER BAG 3000ML (MISCELLANEOUS) IMPLANT
IV NS IRRIG 3000ML ARTHROMATIC (IV SOLUTION) ×1 IMPLANT
KIT TURNOVER CYSTO (KITS) ×1 IMPLANT
LASER FIB FLEXIVA PULSE ID 365 (Laser) IMPLANT
MANIFOLD NEPTUNE II (INSTRUMENTS) ×1 IMPLANT
NS IRRIG 500ML POUR BTL (IV SOLUTION) ×1 IMPLANT
PACK CYSTO (CUSTOM PROCEDURE TRAY) ×1 IMPLANT
SHEATH NAVIGATOR HD 11/13X36 (SHEATH) IMPLANT
SLEEVE SCD COMPRESS KNEE MED (STOCKING) ×1 IMPLANT
TRACTIP FLEXIVA PULS ID 200XHI (Laser) IMPLANT
TRACTIP FLEXIVA PULSE ID 200 (Laser) ×1
TUBE CONNECTING 12X1/4 (SUCTIONS) ×1 IMPLANT
TUBING UROLOGY SET (TUBING) IMPLANT

## 2022-10-29 NOTE — Op Note (Signed)
Procedure: 1.  Cystoscopy with right retrograde pyelogram and interpretation. 2.  Right ureteroscopy with holmium laser application, stone extraction and insertion of right double-J stent. 3.  Application of fluoroscopy.  Preop diagnosis: Right renal stones.  Postop diagnosis: Same.  Surgeon: Dr. Bjorn Pippin.  Anesthesia: General.  Specimen: Stone fragments, given to family.  Drains: 6 French by 22 cm right contour double-J stent with tether.  EBL: None.  Complications: None.  Indications: The patient is a 57 year old female with a history of recurrent urolithiasis who was recently seen in the office with right flank pain and symptoms suggestive of a ureteral stone.  She was found on prior imaging to have approximately a 6 to 8 mm right lower pole stone and with her symptoms suggestive of ureteral stone she is elected to undergo ureteroscopy.  She subsequently has passed the ureteral stone per her report but since she still had the right lower pole stone she elected proceed with ureteroscopy.  Procedure: She was taken the operating room where she was given antibiotics.  General anesthetic was induced.  She was placed in lithotomy position and fitted with PAS hose.  Her perineum and genitalia were prepped Betadine solution she was draped in usual sterile fashion.  Cystoscopy was performed using the 21 [place] scope and 30 degree lens.  Examination revealed a normal urethra.  Bladder wall was smooth and pale without tumors, stones or inflammation.  Ureteral orifices were unremarkable.  The right ureteral orifice was cannulated with a 5 [place] open-ended catheter and Omnipaque was instilled.  The right retrograde pyelogram revealed a normal caliber ureter up to the UPJ which was slightly narrowed with an extrarenal pelvis but Chris calyces.  There was a filling defect in the lower pole consistent with the stone that was seen on the spot film.  There is also calcification that appeared to be in  a midpole calyx that is approximately 3 to 4 mm.  A sensor wire was then advanced the kidney under fluoroscopic guidance and the open-ended cath and cystoscope were removed.  The 70 French inner core of a 36 cm digital access sheath was then passed over the wire to the kidney without difficulty.  The assembled sheath was then passed without difficulty and the inner core and wire removed.  The dual-lumen digital ureteroscope was then advanced to the kidney through the sheath.  It was a little tight at the UPJ.  Inspection of the kidney demonstrated that the midpole stone was embedded and not accessible.  I can see it just beneath the mucosa.  There was the expected lower pole stone found.  A 242 m holmium laser fiber was then passed through the scope with the laser set on the dusting setting at 0.3 J and 53 hz on the right pedal and 1 J and 20 Hz on the left.  The stone was then fragmented using a combination of energies.  An engage basket was then used to remove the residual fragments.  Once all significant residual fragments had been removed and final inspection of the kidney had been completed, a sensor wire was advanced back to the ureteroscope which was then removed over the wire along with the sheath.  A 6 French by 22 cm contour double-J stent with tether was then advanced to the kidney under fluoroscopic guidance over the wire.  The wire was then removed and a good coil in the kidney.  The ureteroscope was then inserted and the distal end of the stent was  pulled into the proper position.  The cystoscope was then reinserted to further confirm position and drain the bladder.  Final fluoroscopy revealed good position of the stent.  The stent string was tied close to the urethra and trimmed to an appropriate length and type vaginally.  She was taken down from lithotomy position, her anesthetic was reversed and she was moved to recovery room in stable condition.  There were no complications.  The stone  fragments were given to her husband for the bring to the office.

## 2022-10-29 NOTE — Discharge Instructions (Addendum)
You may remove your stent by pulling the attached string which is tucked vaginally next Weds morning.  If you don't feel you can do that, please call the office to get it removed.    Post Anesthesia Home Care Instructions  Activity: Get plenty of rest for the remainder of the day. A responsible individual must stay with you for 24 hours following the procedure.  For the next 24 hours, DO NOT: -Drive a car -Advertising copywriter -Drink alcoholic beverages -Take any medication unless instructed by your physician -Make any legal decisions or sign important papers.  Meals: Start with liquid foods such as gelatin or soup. Progress to regular foods as tolerated. Avoid greasy, spicy, heavy foods. If nausea and/or vomiting occur, drink only clear liquids until the nausea and/or vomiting subsides. Call your physician if vomiting continues.  Special Instructions/Symptoms: Your throat may feel dry or sore from the anesthesia or the breathing tube placed in your throat during surgery. If this causes discomfort, gargle with warm salt water. The discomfort should disappear within 24 hours.  If you had a scopolamine patch placed behind your ear for the management of post- operative nausea and/or vomiting:  1. The medication in the patch is effective for 72 hours, after which it should be removed.  Wrap patch in a tissue and discard in the trash. Wash hands thoroughly with soap and water. 2. You may remove the patch earlier than 72 hours if you experience unpleasant side effects which may include dry mouth, dizziness or visual disturbances. 3. Avoid touching the patch. Wash your hands with soap and water after contact with the patch.    No ibuprofen, Advil, Aleve, Motrin, ketorolac, meloxicam, naproxen, or other NSAIDS until after 2:05 pm today if needed. No acetaminophen/Tylenol until after 2:05 pm today if needed.

## 2022-10-29 NOTE — Interval H&P Note (Signed)
History and Physical Interval Note:  She passed the smaller stone.   10/29/2022 9:58 AM  Angela Santos  has presented today for surgery, with the diagnosis of RIGHT RENAL STONES.  The various methods of treatment have been discussed with the patient and family. After consideration of risks, benefits and other options for treatment, the patient has consented to  Procedure(s): CYSTOSCOPYRIGHT   RETRGRADE PYELOGRAM RIGHT   /URETEROSCOPY/HOLMIUM LASER/STENT PLACEMENT (Right) as a surgical intervention.  The patient's history has been reviewed, patient examined, no change in status, stable for surgery.  I have reviewed the patient's chart and labs.  Questions were answered to the patient's satisfaction.     Bjorn Pippin

## 2022-10-29 NOTE — Transfer of Care (Signed)
Immediate Anesthesia Transfer of Care Note  Patient: Angela Santos  Procedure(s) Performed: Pearletha Furl PYELOGRAM RIGHT   /URETEROSCOPY/HOLMIUM LASER/STENT PLACEMENT (Right: Renal)  Patient Location: PACU  Anesthesia Type:General  Level of Consciousness: drowsy and responds to stimulation  Airway & Oxygen Therapy: Patient Spontanous Breathing and Patient connected to face mask oxygen  Post-op Assessment: Report given to RN and Post -op Vital signs reviewed and stable  Post vital signs: Reviewed and stable  Last Vitals:  Vitals Value Taken Time  BP 124/71 10/29/22 1130  Temp 36.3 C 10/29/22 1123  Pulse 66 10/29/22 1138  Resp 25 10/29/22 1138  SpO2 95 % 10/29/22 1138  Vitals shown include unfiled device data.  Last Pain:  Vitals:   10/29/22 1123  TempSrc:   PainSc: 0-No pain      Patients Stated Pain Goal: 7 (10/29/22 0800)  Complications: No notable events documented.

## 2022-10-29 NOTE — Anesthesia Preprocedure Evaluation (Signed)
Anesthesia Evaluation  Patient identified by MRN, date of birth, ID band Patient awake    Reviewed: Allergy & Precautions, NPO status , Patient's Chart, lab work & pertinent test results  History of Anesthesia Complications (+) PONV and history of anesthetic complications  Airway Mallampati: I  TM Distance: >3 FB Neck ROM: Full    Dental  (+) Teeth Intact, Dental Advisory Given   Pulmonary Current Smoker and Patient abstained from smoking.   breath sounds clear to auscultation       Cardiovascular + dysrhythmias  Rhythm:Regular Rate:Normal     Neuro/Psych  PSYCHIATRIC DISORDERS Anxiety Depression       GI/Hepatic negative GI ROS, Neg liver ROS,,,  Endo/Other  negative endocrine ROS    Renal/GU Renal InsufficiencyRenal disease  negative genitourinary   Musculoskeletal negative musculoskeletal ROS (+)    Abdominal Normal abdominal exam  (+)   Peds  Hematology negative hematology ROS (+)   Anesthesia Other Findings   Reproductive/Obstetrics                              Lab Results  Component Value Date   WBC 3.4 (L) 06/14/2017   HGB 11.9 (L) 06/15/2017   HCT 34.1 (L) 06/15/2017   MCV 89.8 06/14/2017   PLT 193 06/14/2017   Lab Results  Component Value Date   CREATININE 0.86 06/14/2017   BUN 31 (H) 06/14/2017   NA 142 06/14/2017   K 4.1 06/14/2017   CL 109 06/14/2017   CO2 22 06/14/2017   Lab Results  Component Value Date   INR 0.94 06/14/2017   EKG: sinus bradycardia, incomplete RBBB.   Anesthesia Physical Anesthesia Plan  ASA: 2  Anesthesia Plan: General   Post-op Pain Management: Minimal or no pain anticipated   Induction: Intravenous  PONV Risk Score and Plan: 4 or greater and Ondansetron, Scopolamine patch - Pre-op and TIVA  Airway Management Planned: Oral ETT and LMA  Additional Equipment: None  Intra-op Plan:   Post-operative Plan: Extubation in  OR  Informed Consent: I have reviewed the patients History and Physical, chart, labs and discussed the procedure including the risks, benefits and alternatives for the proposed anesthesia with the patient or authorized representative who has indicated his/her understanding and acceptance.     Dental advisory given  Plan Discussed with: CRNA and Anesthesiologist  Anesthesia Plan Comments:          Anesthesia Quick Evaluation

## 2022-10-29 NOTE — Anesthesia Procedure Notes (Signed)
Procedure Name: LMA Insertion Date/Time: 10/29/2022 10:06 AM  Performed by: Bishop Limbo, CRNAPre-anesthesia Checklist: Patient identified, Emergency Drugs available, Suction available and Patient being monitored Patient Re-evaluated:Patient Re-evaluated prior to induction Oxygen Delivery Method: Circle System Utilized Preoxygenation: Pre-oxygenation with 100% oxygen Induction Type: IV induction Ventilation: Mask ventilation without difficulty LMA: LMA inserted LMA Size: 3.0 Number of attempts: 1 Airway Equipment and Method: Bite block Placement Confirmation: positive ETCO2 Tube secured with: Tape Dental Injury: Teeth and Oropharynx as per pre-operative assessment

## 2022-10-29 NOTE — Anesthesia Postprocedure Evaluation (Signed)
Anesthesia Post Note  Patient: [name]  Procedure(s) Performed: Pearletha Furl PYELOGRAM RIGHT   /URETEROSCOPY/HOLMIUM LASER/STENT PLACEMENT (Right: Renal)     Patient location during evaluation: PACU Anesthesia Type: General Level of consciousness: awake and alert Pain management: pain level controlled Vital Signs Assessment: post-procedure vital signs reviewed and stable Respiratory status: spontaneous breathing, nonlabored ventilation, respiratory function stable and patient connected to nasal cannula oxygen Cardiovascular status: blood pressure returned to baseline and stable Postop Assessment: no apparent nausea or vomiting Anesthetic complications: no   No notable events documented.  Last Vitals:  Vitals:   10/29/22 1143 10/29/22 1208  BP: 116/64 (!) 115/56  Pulse: 68 (!) 51  Resp: 18 14  Temp:    SpO2: 95% 96%    Last Pain:  Vitals:   10/29/22 1143  TempSrc:   PainSc: 0-No pain                 [name]

## 2022-11-01 ENCOUNTER — Encounter (HOSPITAL_BASED_OUTPATIENT_CLINIC_OR_DEPARTMENT_OTHER): Payer: Self-pay | Admitting: Urology

## 2023-02-25 ENCOUNTER — Other Ambulatory Visit (HOSPITAL_COMMUNITY): Payer: Self-pay | Admitting: Nurse Practitioner

## 2023-02-25 DIAGNOSIS — N130 Hydronephrosis with ureteropelvic junction obstruction: Secondary | ICD-10-CM

## 2023-02-25 DIAGNOSIS — R109 Unspecified abdominal pain: Secondary | ICD-10-CM

## 2023-03-09 ENCOUNTER — Encounter (HOSPITAL_COMMUNITY)
Admission: RE | Admit: 2023-03-09 | Discharge: 2023-03-09 | Disposition: A | Discharge status: Home / Self Care | Payer: 59 | Source: Ambulatory Visit | Attending: Nurse Practitioner | Admitting: Nurse Practitioner

## 2023-03-09 DIAGNOSIS — R109 Unspecified abdominal pain: Secondary | ICD-10-CM | POA: Diagnosis present

## 2023-03-09 DIAGNOSIS — N130 Hydronephrosis with ureteropelvic junction obstruction: Secondary | ICD-10-CM | POA: Diagnosis present

## 2023-03-09 DIAGNOSIS — Z87442 Personal history of urinary calculi: Secondary | ICD-10-CM | POA: Diagnosis present

## 2023-03-09 MED ORDER — FUROSEMIDE 10 MG/ML IJ SOLN
24.0000 mg | Freq: Once | INTRAMUSCULAR | Status: AC
  Administered 2023-03-09: 24 mg via INTRAVENOUS

## 2023-03-09 MED ORDER — FUROSEMIDE 10 MG/ML IJ SOLN
INTRAMUSCULAR | Status: AC
  Filled 2023-03-09: qty 4

## 2023-03-09 MED ORDER — TECHNETIUM TC 99M MERTIATIDE
5.4000 | Freq: Once | INTRAVENOUS | Status: AC
  Administered 2023-03-09: 5.4 via INTRAVENOUS
